# Patient Record
Sex: Female | Born: 1975 | Race: White | Hispanic: No | Marital: Married | Smoking: Former smoker
Health system: Southern US, Community
[De-identification: ages and names within clinical notes are randomized; demographics above are authoritative.]

## PROBLEM LIST (undated history)

## (undated) DIAGNOSIS — F909 Attention-deficit hyperactivity disorder, unspecified type: Secondary | ICD-10-CM

## (undated) DIAGNOSIS — F319 Bipolar disorder, unspecified: Secondary | ICD-10-CM

## (undated) DIAGNOSIS — T4145XA Adverse effect of unspecified anesthetic, initial encounter: Secondary | ICD-10-CM

## (undated) DIAGNOSIS — T8859XA Other complications of anesthesia, initial encounter: Secondary | ICD-10-CM

## (undated) HISTORY — PX: WISDOM TOOTH EXTRACTION: SHX21

## (undated) HISTORY — PX: OTHER SURGICAL HISTORY: SHX169

---

## 2005-05-08 ENCOUNTER — Other Ambulatory Visit: Admission: RE | Admit: 2005-05-08 | Discharge: 2005-05-08 | Payer: Self-pay | Admitting: Obstetrics and Gynecology

## 2005-05-19 ENCOUNTER — Encounter: Admission: RE | Admit: 2005-05-19 | Discharge: 2005-05-19 | Payer: Self-pay | Admitting: *Deleted

## 2006-04-19 ENCOUNTER — Ambulatory Visit (HOSPITAL_COMMUNITY): Payer: Self-pay | Admitting: *Deleted

## 2006-05-15 ENCOUNTER — Emergency Department (HOSPITAL_COMMUNITY): Admission: EM | Admit: 2006-05-15 | Discharge: 2006-05-16 | Payer: Self-pay | Admitting: Emergency Medicine

## 2006-08-09 ENCOUNTER — Other Ambulatory Visit: Admission: RE | Admit: 2006-08-09 | Discharge: 2006-08-09 | Payer: Self-pay | Admitting: Obstetrics and Gynecology

## 2007-09-09 ENCOUNTER — Other Ambulatory Visit: Admission: RE | Admit: 2007-09-09 | Discharge: 2007-09-09 | Payer: Self-pay | Admitting: Obstetrics & Gynecology

## 2009-01-18 ENCOUNTER — Inpatient Hospital Stay (HOSPITAL_COMMUNITY): Admission: AD | Admit: 2009-01-18 | Discharge: 2009-01-20 | Payer: Self-pay | Admitting: Obstetrics and Gynecology

## 2009-02-08 ENCOUNTER — Ambulatory Visit: Admission: RE | Admit: 2009-02-08 | Discharge: 2009-02-08 | Payer: Self-pay | Admitting: Obstetrics and Gynecology

## 2009-04-14 ENCOUNTER — Ambulatory Visit: Payer: Self-pay | Admitting: Licensed Clinical Social Worker

## 2009-06-21 ENCOUNTER — Ambulatory Visit: Payer: Self-pay | Admitting: Licensed Clinical Social Worker

## 2011-01-18 LAB — CBC
HCT: 31.7 % — ABNORMAL LOW (ref 36.0–46.0)
Hemoglobin: 12.3 g/dL (ref 12.0–15.0)
MCHC: 34.1 g/dL (ref 30.0–36.0)
MCV: 96.2 fL (ref 78.0–100.0)
MCV: 97.5 fL (ref 78.0–100.0)
RBC: 3.8 MIL/uL — ABNORMAL LOW (ref 3.87–5.11)
RDW: 14.4 % (ref 11.5–15.5)
WBC: 18.4 10*3/uL — ABNORMAL HIGH (ref 4.0–10.5)

## 2011-01-18 LAB — RPR: RPR Ser Ql: NONREACTIVE

## 2011-02-21 NOTE — Discharge Summary (Signed)
NAMESHERRAL, DIROCCO               ACCOUNT NO.:  [id number]   MEDICAL RECORD NO.:  [id number]          PATIENT TYPE:  INP   LOCATION:  9115                          FACILITY:  [facility]   PHYSICIAN:  Gerrit Friends. Aldona Bar, M.D.   DATE OF BIRTH:  1976-07-08   DATE OF ADMISSION:  01/18/2009  DATE OF DISCHARGE:                               DISCHARGE SUMMARY   DISCHARGE DIAGNOSES:  1. Term pregnancy, delivered 7 pounds 4 ounces, female infant, Apgars 8      and 9.  2. Blood type O+.   PROCEDURES:  1. Normal spontaneous delivery.  2. First-degree tear and repair.   SUMMARY:  This 35 year old gravida 2, now para 1, was admitted in labor  at term after benign pregnancy.  She progressed and had a normal  spontaneous delivery of a viable female infant weighing 7 pounds 4 ounces  with good Apgar over first-degree tear and a right labial tear, both of  which were repaired without difficulty.  Her postpartum course was  benign.  She had a hemoglobin of 10.8 and a white count of 15,300, and a  platelet count of 209,000 on January 19, 2009.  On the morning of January 20, 2009, she was doing well, vital signs were stable, she was breast-  feeding without difficulty, having normal bowel and bladder function,  was afebrile, and she was desirous of discharge.  Accordingly, she was  given all appropriate instructions per discharge brochure and understood  all instructions well.   DISCHARGE MEDICATIONS:  Vitamins one a day as long as she is breast-  feeding.  She will also use Motrin 600 mg every 6 hours as needed for  cramping or mild pain and Tylox 1-2 every 4-6 hours as needed for more  severe pain.  She was given prescriptions for both of these medications.  She will return to the office for followup in approximately 4 weeks'  time or as needed.   CONDITION ON DISCHARGE:  Improved.      Gerrit Friends. Aldona Bar, M.D.  Electronically Signed     RMW/MEDQ  D:  01/20/2009  T:  01/21/2009  Job:  [id number]

## 2016-12-12 ENCOUNTER — Other Ambulatory Visit: Payer: Self-pay | Admitting: Obstetrics and Gynecology

## 2016-12-12 DIAGNOSIS — R928 Other abnormal and inconclusive findings on diagnostic imaging of breast: Secondary | ICD-10-CM

## 2016-12-15 ENCOUNTER — Ambulatory Visit
Admission: RE | Admit: 2016-12-15 | Discharge: 2016-12-15 | Disposition: A | Discharge status: Home / Self Care | Payer: BLUE CROSS/BLUE SHIELD | Source: Ambulatory Visit | Attending: Obstetrics and Gynecology | Admitting: Obstetrics and Gynecology

## 2016-12-15 DIAGNOSIS — R928 Other abnormal and inconclusive findings on diagnostic imaging of breast: Secondary | ICD-10-CM

## 2017-10-01 ENCOUNTER — Emergency Department (HOSPITAL_BASED_OUTPATIENT_CLINIC_OR_DEPARTMENT_OTHER): Payer: BLUE CROSS/BLUE SHIELD

## 2017-10-01 ENCOUNTER — Other Ambulatory Visit: Payer: Self-pay

## 2017-10-01 ENCOUNTER — Encounter (HOSPITAL_BASED_OUTPATIENT_CLINIC_OR_DEPARTMENT_OTHER): Payer: Self-pay | Admitting: Emergency Medicine

## 2017-10-01 ENCOUNTER — Emergency Department (HOSPITAL_BASED_OUTPATIENT_CLINIC_OR_DEPARTMENT_OTHER)
Admission: EM | Admit: 2017-10-01 | Discharge: 2017-10-01 | Disposition: A | Discharge status: Home / Self Care | Payer: BLUE CROSS/BLUE SHIELD | Attending: Emergency Medicine | Admitting: Emergency Medicine

## 2017-10-01 DIAGNOSIS — S42031A Displaced fracture of lateral end of right clavicle, initial encounter for closed fracture: Secondary | ICD-10-CM | POA: Insufficient documentation

## 2017-10-01 DIAGNOSIS — Y92009 Unspecified place in unspecified non-institutional (private) residence as the place of occurrence of the external cause: Secondary | ICD-10-CM | POA: Diagnosis not present

## 2017-10-01 DIAGNOSIS — Y999 Unspecified external cause status: Secondary | ICD-10-CM | POA: Diagnosis not present

## 2017-10-01 DIAGNOSIS — Y9389 Activity, other specified: Secondary | ICD-10-CM | POA: Diagnosis not present

## 2017-10-01 DIAGNOSIS — W19XXXA Unspecified fall, initial encounter: Secondary | ICD-10-CM

## 2017-10-01 DIAGNOSIS — S4991XA Unspecified injury of right shoulder and upper arm, initial encounter: Secondary | ICD-10-CM | POA: Diagnosis present

## 2017-10-01 HISTORY — DX: Attention-deficit hyperactivity disorder, unspecified type: F90.9

## 2017-10-01 MED ORDER — TRAMADOL HCL 50 MG PO TABS: 50.0000 mg | ORAL_TABLET | Freq: Four times a day (QID) | ORAL | 0 refills | Status: DC | PRN

## 2017-10-01 MED ORDER — OXYCODONE-ACETAMINOPHEN 5-325 MG PO TABS
1.0000 | ORAL_TABLET | Freq: Once | ORAL | Status: DC
  Filled 2017-10-01: qty 1

## 2017-10-01 MED ORDER — TRAMADOL HCL 50 MG PO TABS
50.0000 mg | ORAL_TABLET | Freq: Once | ORAL | Status: AC
  Administered 2017-10-01: 50 mg via ORAL
  Filled 2017-10-01: qty 1

## 2017-10-01 NOTE — Discharge Instructions (Signed)
Wear the sling until you see the orthopedic doctor. Apply ice several times a day.   Sleep in a recliner until you are able to get up from a lying position without severe pain.    Take ibuprofen or naproxen as needed for less severe pain

## 2017-10-01 NOTE — ED Provider Notes (Signed)
[facility] EMERGENCY DEPARTMENT Provider Note   CSN: [id number] Arrival date & time: 10/01/17  0351     History   Chief Complaint Chief Complaint  Patient presents with  . Shoulder Injury    HPI Gina Baldwin is a 41 y.o. female.  The history is provided by the patient.  She fell off of a hover board injuring her right shoulder.  She denies other injury.  She denies head, neck, back injury.  She rates pain at 10/10.  She applied an Ace bandage to try to immobilize it and came to the emergency department.  Past Medical History:  Diagnosis Date  . ADHD     There are no active problems to display for this patient.   Past Surgical History:  Procedure Laterality Date  . tubaligation      OB History    No data available       Home Medications    Prior to Admission medications   Not on File    Family History No family history on file.  Social History Social History   Tobacco Use  . Smoking status: Former Games developermoker  . Smokeless tobacco: Never Used  Substance Use Topics  . Alcohol use: Yes  . Drug use: No     Allergies   Patient has no known allergies.   Review of Systems Review of Systems  All other systems reviewed and are negative.    Physical Exam Updated Vital Signs BP 120/78 (BP Location: Left Arm)   Pulse 83   Temp 97.6 F (36.4 C) (Oral)   Resp 20   Ht 5\' 7"  (1.702 m)   Wt 57.6 kg (127 lb)   LMP 09/28/2017   SpO2 100%   BMI 19.89 kg/m   Physical Exam  Nursing note and vitals reviewed.  41 year old female, resting comfortably and in no acute distress. Vital signs are normal. Oxygen saturation is 100%, which is normal. Head is normocephalic and atraumatic. PERRLA, EOMI. Oropharynx is clear. Neck is nontender and supple without adenopathy or JVD. Back is nontender and there is no CVA tenderness. Lungs are clear without rales, wheezes, or rhonchi. Chest is nontender. Heart has regular rate and rhythm without  murmur. Abdomen is soft, flat, nontender without masses or hepatosplenomegaly and peristalsis is normoactive. Extremities: Deformity is present over the right clavicle with tenderness in the same area.  No other extremity injury seen. Skin is warm and dry without rash. Neurologic: Mental status is normal, cranial nerves are intact, there are no motor or sensory deficits.  ED Treatments / Results   Radiology Dg Clavicle Right  Result Date: 10/01/2017 CLINICAL DATA:  Fall EXAM: RIGHT CLAVICLE - 2+ VIEWS COMPARISON:  None. FINDINGS: There is a comminuted fracture of the distal aspect of the right clavicle. The main fragments are inferiorly displaced. The acromioclavicular and glenohumeral joints remain approximated. IMPRESSION: Comminuted fracture of the distal right clavicle without dislocation. Electronically Signed   By: Deatra RobinsonKevin  Herman M.D.   On: 10/01/2017 04:49   Dg Shoulder Right  Result Date: 10/01/2017 CLINICAL DATA:  Status post fall off hover board, with acute onset of right shoulder pain. Initial encounter. EXAM: RIGHT SHOULDER - 2+ VIEW COMPARISON:  None. FINDINGS: There is a comminuted fracture of the distal third of the right clavicle, with variable displacement depending on positioning. This appears to extend to the edge of the right acromioclavicular joint. The proximal right humerus appears intact. The right humeral head remains seated at  the glenoid fossa. The visualized portions of the lungs are grossly clear. IMPRESSION: Comminuted fracture of the distal third of the right clavicle, with variable displacement depending on positioning. This appears to extend to the edge of the right acromioclavicular joint. Electronically Signed   By: Roanna RaiderJeffery  Chang M.D.   On: 10/01/2017 04:32    Procedures Procedures (including critical care time)  Medications Ordered in ED Medications  oxyCODONE-acetaminophen (PERCOCET/ROXICET) 5-325 MG per tablet 1 tablet (not administered)     Initial  Impression / Assessment and Plan / ED Course  I have reviewed the triage vital signs and the nursing notes.  Pertinent labs & imaging results that were available during my care of the patient were reviewed by me and considered in my medical decision making (see chart for details).  Fall with apparent injury to right clavicle.  She is sent for x-rays.  Old records were reviewed, and she has no relevant past visits.  X-rays confirm clavicle fracture.  She is placed in a sling and referred to orthopedics for follow-up.  Prescription given for tramadol (she has severe nausea and vomiting if she takes oxycodone).  Final Clinical Impressions(s) / ED Diagnoses   Final diagnoses:  Fall at home, initial encounter  Traumatic closed displaced fracture of acromial end of clavicle, right, initial encounter    ED Discharge Orders        Ordered    traMADol (ULTRAM) 50 MG tablet  Every 6 hours PRN     10/01/17 0456       Dione BoozeGlick, Aerabella Galasso, MD 10/01/17 [id number]

## 2017-10-01 NOTE — ED Notes (Signed)
PMS intact before and after. Pt tolerated well. All questions answered. 

## 2017-10-01 NOTE — ED Triage Notes (Signed)
Pt presents with c/o right shoulder pain after falling off hoover board tonight.

## 2017-10-10 ENCOUNTER — Encounter (HOSPITAL_COMMUNITY): Payer: Self-pay | Admitting: *Deleted

## 2017-10-10 ENCOUNTER — Other Ambulatory Visit: Payer: Self-pay | Admitting: Orthopedic Surgery

## 2017-10-10 ENCOUNTER — Other Ambulatory Visit: Payer: Self-pay

## 2017-10-10 NOTE — Progress Notes (Signed)
Spoke with pt for pre-op call. Pt denies cardiac history, chest pain, sob or diabetes. 

## 2017-10-11 ENCOUNTER — Ambulatory Visit (HOSPITAL_COMMUNITY): Payer: BLUE CROSS/BLUE SHIELD | Admitting: Anesthesiology

## 2017-10-11 ENCOUNTER — Ambulatory Visit (HOSPITAL_COMMUNITY)
Admission: RE | Admit: 2017-10-11 | Discharge: 2017-10-11 | Disposition: A | Discharge status: Home / Self Care | Payer: BLUE CROSS/BLUE SHIELD | Source: Ambulatory Visit | Attending: Orthopedic Surgery | Admitting: Orthopedic Surgery

## 2017-10-11 ENCOUNTER — Ambulatory Visit (HOSPITAL_COMMUNITY): Payer: BLUE CROSS/BLUE SHIELD

## 2017-10-11 ENCOUNTER — Encounter (HOSPITAL_COMMUNITY)
Admission: RE | Disposition: A | Discharge status: Home / Self Care | Payer: Self-pay | Source: Ambulatory Visit | Attending: Orthopedic Surgery

## 2017-10-11 ENCOUNTER — Encounter (HOSPITAL_COMMUNITY): Payer: Self-pay

## 2017-10-11 DIAGNOSIS — Z91018 Allergy to other foods: Secondary | ICD-10-CM | POA: Diagnosis not present

## 2017-10-11 DIAGNOSIS — W19XXXA Unspecified fall, initial encounter: Secondary | ICD-10-CM | POA: Diagnosis not present

## 2017-10-11 DIAGNOSIS — Z79899 Other long term (current) drug therapy: Secondary | ICD-10-CM | POA: Insufficient documentation

## 2017-10-11 DIAGNOSIS — S42031A Displaced fracture of lateral end of right clavicle, initial encounter for closed fracture: Secondary | ICD-10-CM | POA: Insufficient documentation

## 2017-10-11 DIAGNOSIS — F909 Attention-deficit hyperactivity disorder, unspecified type: Secondary | ICD-10-CM | POA: Diagnosis not present

## 2017-10-11 DIAGNOSIS — Z885 Allergy status to narcotic agent status: Secondary | ICD-10-CM | POA: Diagnosis not present

## 2017-10-11 DIAGNOSIS — F319 Bipolar disorder, unspecified: Secondary | ICD-10-CM | POA: Insufficient documentation

## 2017-10-11 DIAGNOSIS — Z87891 Personal history of nicotine dependence: Secondary | ICD-10-CM | POA: Insufficient documentation

## 2017-10-11 DIAGNOSIS — Z419 Encounter for procedure for purposes other than remedying health state, unspecified: Secondary | ICD-10-CM

## 2017-10-11 HISTORY — DX: Adverse effect of unspecified anesthetic, initial encounter: T41.45XA

## 2017-10-11 HISTORY — DX: Other complications of anesthesia, initial encounter: T88.59XA

## 2017-10-11 HISTORY — DX: Bipolar disorder, unspecified: F31.9

## 2017-10-11 HISTORY — PX: ORIF CLAVICULAR FRACTURE: SHX5055

## 2017-10-11 LAB — CBC
HCT: 35.8 % — ABNORMAL LOW (ref 36.0–46.0)
Hemoglobin: 11.8 g/dL — ABNORMAL LOW (ref 12.0–15.0)
MCH: 31.4 pg (ref 26.0–34.0)
MCHC: 33 g/dL (ref 30.0–36.0)
MCV: 95.2 fL (ref 78.0–100.0)
PLATELETS: 250 10*3/uL (ref 150–400)
RBC: 3.76 MIL/uL — AB (ref 3.87–5.11)
RDW: 12.7 % (ref 11.5–15.5)
WBC: 6.7 10*3/uL (ref 4.0–10.5)

## 2017-10-11 LAB — HCG, SERUM, QUALITATIVE: Preg, Serum: NEGATIVE

## 2017-10-11 SURGERY — OPEN REDUCTION INTERNAL FIXATION (ORIF) CLAVICULAR FRACTURE
Anesthesia: General | Laterality: Right

## 2017-10-11 MED ORDER — 0.9 % SODIUM CHLORIDE (POUR BTL) OPTIME
TOPICAL | Status: DC | PRN
  Administered 2017-10-11: 1000 mL

## 2017-10-11 MED ORDER — ROCURONIUM BROMIDE 10 MG/ML (PF) SYRINGE
PREFILLED_SYRINGE | INTRAVENOUS | Status: AC
  Filled 2017-10-11: qty 5

## 2017-10-11 MED ORDER — MEPERIDINE HCL 25 MG/ML IJ SOLN: 6.2500 mg | INTRAMUSCULAR | Status: DC | PRN

## 2017-10-11 MED ORDER — EPHEDRINE SULFATE-NACL 50-0.9 MG/10ML-% IV SOSY
PREFILLED_SYRINGE | INTRAVENOUS | Status: DC | PRN
  Administered 2017-10-11: 7.5 mg via INTRAVENOUS

## 2017-10-11 MED ORDER — BUPIVACAINE-EPINEPHRINE 0.25% -1:200000 IJ SOLN
INTRAMUSCULAR | Status: DC | PRN
  Administered 2017-10-11: 10 mL

## 2017-10-11 MED ORDER — ONDANSETRON HCL 4 MG/2ML IJ SOLN
INTRAMUSCULAR | Status: DC | PRN
  Administered 2017-10-11: 4 mg via INTRAVENOUS

## 2017-10-11 MED ORDER — HYDROMORPHONE HCL 1 MG/ML IJ SOLN
INTRAMUSCULAR | Status: AC
  Filled 2017-10-11: qty 1

## 2017-10-11 MED ORDER — EPHEDRINE 5 MG/ML INJ
INTRAVENOUS | Status: AC
  Filled 2017-10-11: qty 10

## 2017-10-11 MED ORDER — MIDAZOLAM HCL 2 MG/2ML IJ SOLN
INTRAMUSCULAR | Status: AC
  Filled 2017-10-11: qty 2

## 2017-10-11 MED ORDER — PROPOFOL 10 MG/ML IV BOLUS
INTRAVENOUS | Status: AC
  Filled 2017-10-11: qty 20

## 2017-10-11 MED ORDER — HYDROMORPHONE HCL 1 MG/ML IJ SOLN
0.2500 mg | INTRAMUSCULAR | Status: DC | PRN
  Administered 2017-10-11 (×4): 0.5 mg via INTRAVENOUS

## 2017-10-11 MED ORDER — PHENYLEPHRINE HCL 10 MG/ML IJ SOLN
INTRAVENOUS | Status: DC | PRN
  Administered 2017-10-11: 10 ug/min via INTRAVENOUS

## 2017-10-11 MED ORDER — FENTANYL CITRATE (PF) 100 MCG/2ML IJ SOLN
INTRAMUSCULAR | Status: DC | PRN
  Administered 2017-10-11 (×5): 50 ug via INTRAVENOUS

## 2017-10-11 MED ORDER — FENTANYL CITRATE (PF) 250 MCG/5ML IJ SOLN
INTRAMUSCULAR | Status: AC
  Filled 2017-10-11: qty 5

## 2017-10-11 MED ORDER — ONDANSETRON HCL 4 MG/2ML IJ SOLN
INTRAMUSCULAR | Status: AC
  Filled 2017-10-11: qty 2

## 2017-10-11 MED ORDER — DEXAMETHASONE SODIUM PHOSPHATE 10 MG/ML IJ SOLN
INTRAMUSCULAR | Status: DC | PRN
  Administered 2017-10-11: 10 mg via INTRAVENOUS

## 2017-10-11 MED ORDER — ROCURONIUM BROMIDE 100 MG/10ML IV SOLN
INTRAVENOUS | Status: DC | PRN
  Administered 2017-10-11: 50 mg via INTRAVENOUS

## 2017-10-11 MED ORDER — ONDANSETRON HCL 4 MG PO TABS: 4.0000 mg | ORAL_TABLET | Freq: Every day | ORAL | 1 refills | Status: AC | PRN

## 2017-10-11 MED ORDER — DEXAMETHASONE SODIUM PHOSPHATE 10 MG/ML IJ SOLN
INTRAMUSCULAR | Status: AC
  Filled 2017-10-11: qty 1

## 2017-10-11 MED ORDER — CEFAZOLIN SODIUM-DEXTROSE 2-4 GM/100ML-% IV SOLN
2.0000 g | INTRAVENOUS | Status: AC
  Administered 2017-10-11: 2 g via INTRAVENOUS
  Filled 2017-10-11: qty 100

## 2017-10-11 MED ORDER — LACTATED RINGERS IV SOLN
INTRAVENOUS | Status: DC | PRN
  Administered 2017-10-11: 07:00:00 via INTRAVENOUS

## 2017-10-11 MED ORDER — BUPIVACAINE-EPINEPHRINE 0.25% -1:200000 IJ SOLN
INTRAMUSCULAR | Status: AC
  Filled 2017-10-11: qty 1

## 2017-10-11 MED ORDER — MIDAZOLAM HCL 5 MG/5ML IJ SOLN
INTRAMUSCULAR | Status: DC | PRN
  Administered 2017-10-11 (×2): 1 mg via INTRAVENOUS

## 2017-10-11 MED ORDER — OXYCODONE-ACETAMINOPHEN 5-325 MG PO TABS: 1.0000 | ORAL_TABLET | ORAL | 0 refills | Status: DC | PRN

## 2017-10-11 MED ORDER — LIDOCAINE HCL (CARDIAC) 20 MG/ML IV SOLN
INTRAVENOUS | Status: DC | PRN
  Administered 2017-10-11: 80 mg via INTRAVENOUS

## 2017-10-11 MED ORDER — PROMETHAZINE HCL 25 MG/ML IJ SOLN: 6.2500 mg | INTRAMUSCULAR | Status: DC | PRN

## 2017-10-11 MED ORDER — LIDOCAINE 2% (20 MG/ML) 5 ML SYRINGE
INTRAMUSCULAR | Status: AC
  Filled 2017-10-11: qty 5

## 2017-10-11 MED ORDER — SUGAMMADEX SODIUM 200 MG/2ML IV SOLN
INTRAVENOUS | Status: AC
  Filled 2017-10-11: qty 2

## 2017-10-11 MED ORDER — PROPOFOL 10 MG/ML IV BOLUS
INTRAVENOUS | Status: DC | PRN
  Administered 2017-10-11: 100 mg via INTRAVENOUS

## 2017-10-11 MED ORDER — POVIDONE-IODINE 7.5 % EX SOLN
Freq: Once | CUTANEOUS | Status: DC
  Filled 2017-10-11: qty 118

## 2017-10-11 SURGICAL SUPPLY — 63 items
BIT DRILL 2.0 LNG QUCK RELEASE (BIT) IMPLANT
BIT DRILL 2.8 QUICK RELEASE (BIT) IMPLANT
BIT DRILL 3.0X5 QUICK RELEASE (DRILL) IMPLANT
CHLORAPREP W/TINT 26ML (MISCELLANEOUS) ×2 IMPLANT
COVER SURGICAL LIGHT HANDLE (MISCELLANEOUS) ×2 IMPLANT
DRAPE C-ARM 42X72 X-RAY (DRAPES) ×2 IMPLANT
DRAPE IMP U-DRAPE 54X76 (DRAPES) ×2 IMPLANT
DRAPE INCISE IOBAN 66X45 STRL (DRAPES) ×2 IMPLANT
DRAPE ORTHO SPLIT 77X108 STRL (DRAPES) ×4
DRAPE SURG ORHT 6 SPLT 77X108 (DRAPES) IMPLANT
DRAPE U-SHAPE 47X51 STRL (DRAPES) ×2 IMPLANT
DRILL 2.0 LNG QUICK RELEASE (BIT) ×2
DRILL 2.8 QUICK RELEASE (BIT) ×2
DRILL 3.0X5 QUICK RELEASE (DRILL) ×2
DRSG MEPILEX BORDER 4X8 (GAUZE/BANDAGES/DRESSINGS) ×1 IMPLANT
ELECT REM PT RETURN 9FT ADLT (ELECTROSURGICAL)
ELECTRODE REM PT RTRN 9FT ADLT (ELECTROSURGICAL) IMPLANT
GLOVE BIO SURGEON STRL SZ7 (GLOVE) ×2 IMPLANT
GLOVE BIO SURGEON STRL SZ7.5 (GLOVE) ×2 IMPLANT
GLOVE BIOGEL PI IND STRL 7.0 (GLOVE) ×1 IMPLANT
GLOVE BIOGEL PI IND STRL 8 (GLOVE) ×1 IMPLANT
GLOVE BIOGEL PI INDICATOR 7.0 (GLOVE) ×1
GLOVE BIOGEL PI INDICATOR 8 (GLOVE) ×1
GOWN STRL REUS W/ TWL LRG LVL3 (GOWN DISPOSABLE) ×3 IMPLANT
GOWN STRL REUS W/ TWL XL LVL3 (GOWN DISPOSABLE) ×1 IMPLANT
GOWN STRL REUS W/TWL LRG LVL3 (GOWN DISPOSABLE) ×6
GOWN STRL REUS W/TWL XL LVL3 (GOWN DISPOSABLE) ×2
KIT AC JOINT DISP (KITS) ×1 IMPLANT
KIT BASIN OR (CUSTOM PROCEDURE TRAY) ×2 IMPLANT
KIT BIO-TENODESIS 3X8 DISP (MISCELLANEOUS)
KIT INSRT BABSR STRL DISP BTN (MISCELLANEOUS) IMPLANT
KIT ROOM TURNOVER OR (KITS) ×2 IMPLANT
MANIFOLD NEPTUNE WASTE (CANNULA) ×2 IMPLANT
NDL HYPO 25GX1X1/2 BEV (NEEDLE) ×1 IMPLANT
NDL SPNL 18GX3.5 QUINCKE PK (NEEDLE) ×1 IMPLANT
NEEDLE HYPO 25GX1X1/2 BEV (NEEDLE) ×2 IMPLANT
NEEDLE SPNL 18GX3.5 QUINCKE PK (NEEDLE) ×2 IMPLANT
NS IRRIG 1000ML POUR BTL (IV SOLUTION) ×2 IMPLANT
PACK SHOULDER (CUSTOM PROCEDURE TRAY) ×2 IMPLANT
PACK UNIVERSAL I (CUSTOM PROCEDURE TRAY) ×1 IMPLANT
PAD ARMBOARD 7.5X6 YLW CONV (MISCELLANEOUS) ×4 IMPLANT
PLATE RIGHT DISTAL CLAVICLE (Plate) ×1 IMPLANT
RETRIEVER SUT HEWSON (MISCELLANEOUS) IMPLANT
SCREW BN FT 16X2.3XLCK HEX CRT (Screw) IMPLANT
SCREW CORTICAL 2.3X14 (Screw) ×1 IMPLANT
SCREW CORTICAL LOCKING 2.3X16M (Screw) ×10 IMPLANT
SCREW HEXALOBE NON-LOCK 3.5X14 (Screw) ×1 IMPLANT
SCREW NONLOCK HEX 3.5X12 (Screw) ×2 IMPLANT
SLING ARM FOAM STRAP LRG (SOFTGOODS) ×1 IMPLANT
SLING ARM FOAM STRAP MED (SOFTGOODS) ×1 IMPLANT
STRIP CLOSURE SKIN 1/2X4 (GAUZE/BANDAGES/DRESSINGS) ×2 IMPLANT
SUCTION FRAZIER HANDLE 10FR (MISCELLANEOUS) ×1
SUCTION TUBE FRAZIER 10FR DISP (MISCELLANEOUS) ×1 IMPLANT
SUT FIBERWIRE #2 38 T-5 BLUE (SUTURE)
SUT MON AB 4-0 PC3 18 (SUTURE) ×2 IMPLANT
SUT PDS AB 1 CT  36 (SUTURE) ×1
SUT PDS AB 1 CT 36 (SUTURE) ×1 IMPLANT
SUTURE FIBERWR #2 38 T-5 BLUE (SUTURE) IMPLANT
SYR CONTROL 10ML LL (SYRINGE) ×2 IMPLANT
TOWEL OR 17X24 6PK STRL BLUE (TOWEL DISPOSABLE) ×2 IMPLANT
TOWEL OR 17X26 10 PK STRL BLUE (TOWEL DISPOSABLE) ×2 IMPLANT
WATER STERILE IRR 1000ML POUR (IV SOLUTION) ×1 IMPLANT
ZIPLOOP AC JOINT REPAIR (Orthopedic Implant) ×1 IMPLANT

## 2017-10-11 NOTE — Anesthesia Procedure Notes (Signed)
Procedure Name: Intubation Date/Time: 10/11/2017 7:36 AM Performed by: Kyung Rudd, CRNA Pre-anesthesia Checklist: Patient identified, Emergency Drugs available, Suction available and Patient being monitored Patient Re-evaluated:Patient Re-evaluated prior to induction Oxygen Delivery Method: Circle system utilized Preoxygenation: Pre-oxygenation with 100% oxygen Induction Type: IV induction Ventilation: Mask ventilation without difficulty Laryngoscope Size: Mac and 3 Grade View: Grade I Tube type: Oral Tube size: 7.0 mm Number of attempts: 1 Airway Equipment and Method: Stylet Placement Confirmation: ETT inserted through vocal cords under direct vision,  positive ETCO2 and breath sounds checked- equal and bilateral Secured at: 21 cm Tube secured with: Tape Dental Injury: Teeth and Oropharynx as per pre-operative assessment

## 2017-10-11 NOTE — H&P (Signed)
Gina Baldwin is an 42 y.o. female.   Chief Complaint: R clavicle fracture HPI: s/p fall onto R shoulder 10/01/17 with comminuted displaced distal clavicle fracture.  Past Medical History:  Diagnosis Date  . ADHD   . ADHD   . Bipolar disorder (HCC)   . Complication of anesthesia    slow to wake up    Past Surgical History:  Procedure Laterality Date  . tubaligation    . WISDOM TOOTH EXTRACTION      Family History  Problem Relation Age of Onset  . Osteoporosis Mother    Social History:  reports that she has quit smoking. she has never used smokeless tobacco. She reports that she drinks alcohol. She reports that she does not use drugs.  Allergies:  Allergies  Allergen Reactions  . Food Anaphylaxis, Swelling and Other (See Comments)    Mushrooms-throat closes/swelling  . Oxycodone-Acetaminophen Nausea And Vomiting    Medications Prior to Admission  Medication Sig Dispense Refill  . ADDERALL XR 20 MG 24 hr capsule Take 40 mg by mouth daily.  0  . amphetamine-dextroamphetamine (ADDERALL) 20 MG tablet Take 20 mg by mouth daily as needed. For additional focus after 3 pm.  0  . Carbamazepine (EQUETRO) 100 MG CP12 12 hr capsule Take 100 mg by mouth every evening.    . cholecalciferol (VITAMIN D) 1000 units tablet Take 1,000 Units by mouth daily.    Marland Kitchen. lamoTRIgine (LAMICTAL) 150 MG tablet Take 150 mg by mouth every evening.  12  . traMADol (ULTRAM) 50 MG tablet Take 1 tablet (50 mg total) by mouth every 6 (six) hours as needed. (Patient taking differently: Take 50 mg by mouth every 6 (six) hours as needed (for pain.). ) 30 tablet 0    No results found for this or any previous visit (from the past 48 hour(s)). No results found.  Review of Systems  All other systems reviewed and are negative.   Blood pressure (!) 114/45, pulse (!) 59, temperature 98.3 F (36.8 C), temperature source Oral, resp. rate 20, height 5\' 7"  (1.702 m), weight 57.6 kg (127 lb), last menstrual period  09/28/2017, SpO2 98 %. Physical Exam  Constitutional: She is oriented to person, place, and time. She appears well-developed and well-nourished.  HENT:  Head: Atraumatic.  Eyes: EOM are normal.  Cardiovascular: Intact distal pulses.  Respiratory: Effort normal.  Musculoskeletal:  R  Shoulder mild swelling ecchymosis lateral clavicle, NVID.  Neurological: She is alert and oriented to person, place, and time.  Skin: Skin is warm and dry.  Psychiatric: She has a normal mood and affect.     Assessment/Plan R displaced distal clavicle fracture Plan ORIF with CC lig recon Risks / benefits of surgery discussed Consent on chart  NPO for OR Preop antibiotics   Berline LopesJustin W Courage Biglow, MD 10/11/2017, 7:09 AM

## 2017-10-11 NOTE — Anesthesia Postprocedure Evaluation (Signed)
Anesthesia Post Note  Patient: Gina Baldwin  Procedure(s) Performed: PEN REDUCTION INTERNAL FIXATION OF CLAVICLE FRACTURE WITH ACROMIOCLAVICULAR RECONSTRUCTION (Right )     Patient location during evaluation: PACU Anesthesia Type: General Level of consciousness: awake and alert Pain management: pain level controlled Vital Signs Assessment: post-procedure vital signs reviewed and stable Respiratory status: spontaneous breathing, nonlabored ventilation and respiratory function stable Cardiovascular status: blood pressure returned to baseline and stable Postop Assessment: no apparent nausea or vomiting Anesthetic complications: no    Last Vitals:  Vitals:   10/11/17 1054 10/11/17 1055  BP: 108/62   Pulse: 75   Resp: (!) 7   Temp:  37.2 C  SpO2: 93%     Last Pain:  Vitals:   10/11/17 1055  TempSrc:   PainSc: Asleep                 Lowella CurbWarren Ray Shabre Kreher

## 2017-10-11 NOTE — Discharge Instructions (Signed)

## 2017-10-11 NOTE — Transfer of Care (Signed)
Immediate Anesthesia Transfer of Care Note  Patient: Gina Baldwin  Procedure(s) Performed: PEN REDUCTION INTERNAL FIXATION OF CLAVICLE FRACTURE WITH ACROMIOCLAVICULAR RECONSTRUCTION (Right )  Patient Location: PACU  Anesthesia Type:General  Level of Consciousness: awake, alert  and oriented  Airway & Oxygen Therapy: Patient Spontanous Breathing and Patient connected to nasal cannula oxygen  Post-op Assessment: Report given to RN, Post -op Vital signs reviewed and stable and Patient moving all extremities  Post vital signs: Reviewed and stable  Last Vitals:  Vitals:   10/11/17 0603 10/11/17 0955  BP: (!) 114/45   Pulse: (!) 59   Resp: 20   Temp: 36.8 C 36.9 C  SpO2: 98%     Last Pain:  Vitals:   10/11/17 0955  TempSrc:   PainSc: 8       Patients Stated Pain Goal: 5 (10/11/17 [id number])  Complications: No apparent anesthesia complications

## 2017-10-11 NOTE — Anesthesia Preprocedure Evaluation (Addendum)
Anesthesia Evaluation  Patient identified by MRN, date of birth, ID band Patient awake    Reviewed: Allergy & Precautions, NPO status , Patient's Chart, lab work & pertinent test results  Airway Mallampati: I  TM Distance: >3 FB Neck ROM: Full    Dental no notable dental hx. (+) Teeth Intact, Dental Advisory Given   Pulmonary neg pulmonary ROS, former smoker,    Pulmonary exam normal breath sounds clear to auscultation       Cardiovascular negative cardio ROS Normal cardiovascular exam Rhythm:Regular Rate:Normal     Neuro/Psych negative neurological ROS  negative psych ROS   GI/Hepatic negative GI ROS, Neg liver ROS,   Endo/Other  negative endocrine ROS  Renal/GU negative Renal ROS  negative genitourinary   Musculoskeletal negative musculoskeletal ROS (+)   Abdominal   Peds negative pediatric ROS (+)  Hematology negative hematology ROS (+)   Anesthesia Other Findings ADHD  Reproductive/Obstetrics negative OB ROS                            Anesthesia Physical Anesthesia Plan  ASA: II  Anesthesia Plan: General   Post-op Pain Management:    Induction: Intravenous  PONV Risk Score and Plan: 3 and Ondansetron, Dexamethasone and Midazolam  Airway Management Planned: Oral ETT  Additional Equipment:   Intra-op Plan:   Post-operative Plan: Extubation in OR  Informed Consent: I have reviewed the patients History and Physical, chart, labs and discussed the procedure including the risks, benefits and alternatives for the proposed anesthesia with the patient or authorized representative who has indicated his/her understanding and acceptance.   Dental advisory given  Plan Discussed with: CRNA  Anesthesia Plan Comments:         Anesthesia Quick Evaluation

## 2017-10-11 NOTE — Op Note (Signed)
Procedure(s): PEN REDUCTION INTERNAL FIXATION OF CLAVICLE FRACTURE WITH ACROMIOCLAVICULAR RECONSTRUCTION Procedure Note  Gina Baldwin female 42 y.o. 10/11/2017  Procedure(s) and Anesthesia Typ4e: OPEN REDUCTION INTERNAL FIXATION OF CLAVICLE FRACTURE WITH ACROMIOCLAVICULAR RECONSTRUCTION - Choice  Surgeon(s) and Role:    Jones Broom, MD - Primary   Indications:  42 y.o. female s/p fall with right comminuted distal clavicle fracture. Indicated for surgery to promote anatomic restoration anatomy, improve functional outcome and avoid skin complications.     Surgeon: Berline Lopes   Assistants: Damita Lack PA-C [facility] was present and scrubbed throughout the procedure and was essential in positioning, retraction, exposure, and closure)  Anesthesia: General endotracheal anesthesia    Procedure Detail  OPEN REDUCTION INTERNAL FIXATION OF CLAVICLE FRACTURE WITH ACROMIOCLAVICULAR RECONSTRUCTION  Findings: Anatomic reconstruction of comminuted distal clavicle fracture with Acumed plate and Biomet zip loop for coracoclavicular fixation   Estimated Blood Loss:  less than 50 mL         Drains: none  Blood Given: none         Specimens: none        Complications:  * No complications entered in OR log *         Disposition: PACU - hemodynamically stable.         Condition: stable    Procedure:  DESCRIPTION OF PROCEDURE: The patient was identified in preoperative  holding area where I personally marked the operative site after  verifying site, side, and procedure with the patient. The patient was taken back  to the operating room where general anesthesia was induced without  complication and was placed in the beach-chair position with the back  elevated about 40 degrees and all extremities carefully padded and  positioned. The neck was turned very slightly away from the operative field  to assist in exposure. The right upper extremity was then prepped and   draped in a standard sterile fashion. The appropriate time-out  procedure was carried out. The patient did receive IV antibiotics  within 30 minutes of incision.  An incision was made in [employer] centered over the fracture site. Dissection was carried down through subcutaneous tissues and medial and lateral skin flaps were elevated.  The deltotrapezial fascia was then opened over the clavicle and the  medial and lateral fracture fragments were carefully exposed, taking great care to protect underlying neurovascular structures.  2 Butterfly fragments were kept in continuity with soft tissue as to not disrupt blood supply. Given the disruption of the coracoclavicular ligament complex causing medial elevation and the very distal fracture with minimal bone laterally I felt that a coracoclavicular fixation device would improve fixation.  Therefore the dorsal aspect of the coracoid was carefully exposed through the fracture site and a guidepin was placed over drilling with a 4.5 mm reamer.  The Biomet zip tight device was advanced under fluoroscopic imaging and had excellent fixation beneath the coracoid centrally. The plate was positioned on the bone using fluoroscopic imaging to verify position.  The medial end of the plate was then fixed with one screw.  The coracoclavicular fixation was then passed through a hole just adjacent to the fracture medially.  The plate was then fixed laterally while holding the anatomic reduction.  The coracoclavicular fixation button was then placed on the dorsal aspect of the plate and fixed in place while holding the reduction.  Fluoroscopic imaging was used throughout the procedure to verify position and reduction.  A total of 4 locking screws  were placed lateral to the fracture and 3 nonlocking screws medially.  One #2 FiberWire was used to reduce the central comminuted butterfly fragments back into the construct with a figure-of-eight suture pattern.  The she will be  observed in recovery and i  The wound was copiously irrigated with normal saline and the deltotrapezial fascia was  then carefully closed over the construct with #0 vicryl sutures in  interrupted fashion. The skin was then closed with 2-0 Vicryl in a deep  dermal layer, 4-0 Monocryl for skin closure. Steri-Strips were applied.  10 mL of 0.25% Marcaine with epinephrine were infiltrated for  postoperative pain. Sterile dressings were applied including a medium  Mepilex dressing. The patient was then allowed to awaken from general  anesthesia, placed in a sling, transferred to stretcher and taken to the  recovery room in stable condition.   POSTOPERATIVE PLAN: She will be observed in recovery and pain is well controlled can be discharged home with her husband.

## 2017-10-12 ENCOUNTER — Encounter (HOSPITAL_COMMUNITY): Payer: Self-pay | Admitting: Orthopedic Surgery

## 2018-02-21 ENCOUNTER — Other Ambulatory Visit: Payer: Self-pay | Admitting: Orthopedic Surgery

## 2018-02-21 DIAGNOSIS — M898X1 Other specified disorders of bone, shoulder: Secondary | ICD-10-CM

## 2018-03-06 ENCOUNTER — Ambulatory Visit
Admission: RE | Admit: 2018-03-06 | Discharge: 2018-03-06 | Disposition: A | Discharge status: Home / Self Care | Payer: BLUE CROSS/BLUE SHIELD | Source: Ambulatory Visit | Attending: Orthopedic Surgery | Admitting: Orthopedic Surgery

## 2018-03-06 DIAGNOSIS — M898X1 Other specified disorders of bone, shoulder: Secondary | ICD-10-CM

## 2018-06-27 ENCOUNTER — Other Ambulatory Visit: Payer: Self-pay | Admitting: Obstetrics and Gynecology

## 2018-06-27 DIAGNOSIS — Z1231 Encounter for screening mammogram for malignant neoplasm of breast: Secondary | ICD-10-CM

## 2019-01-30 IMAGING — DX DG CLAVICLE*R*
2 series · 2 of 2 positions shown · non-contrast
Comparison: None.

CLINICAL DATA: Fall

EXAM:
RIGHT CLAVICLE - 2+ VIEWS

[clavicle ap]
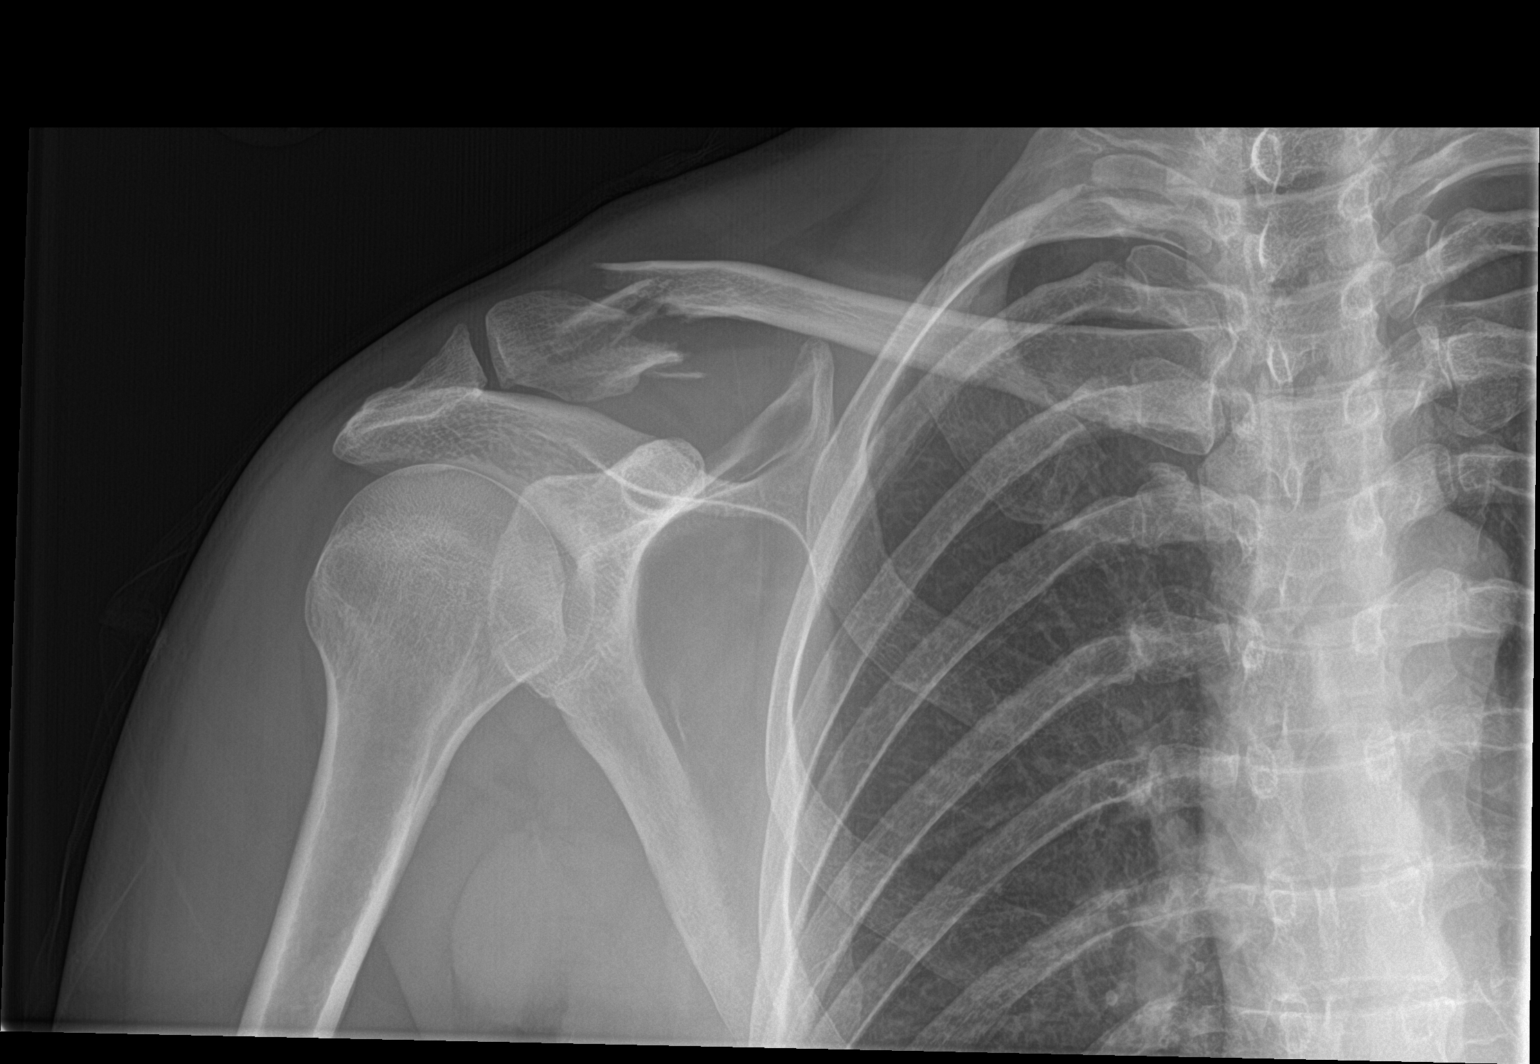

[clavicle axial]
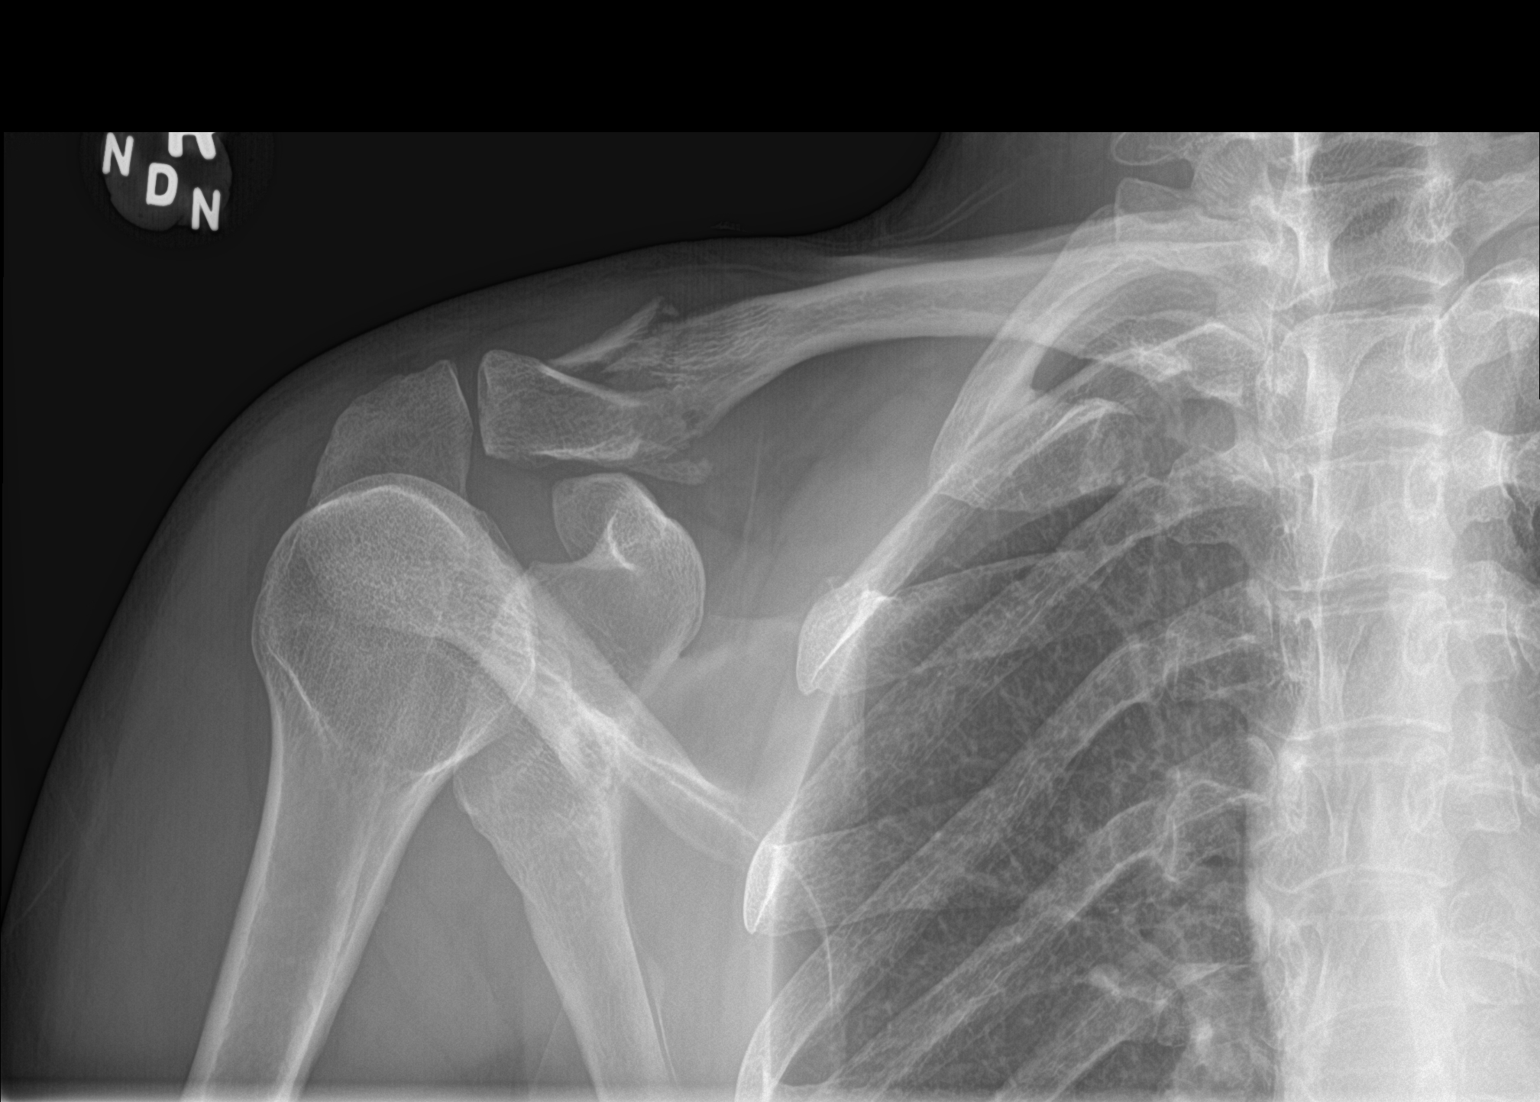

[2 of 2 positions shown; findings below may reference images not displayed]

FINDINGS: There is a comminuted fracture of the distal aspect of the right
clavicle. The main fragments are inferiorly displaced. The
acromioclavicular and glenohumeral joints remain approximated.
IMPRESSION: Comminuted fracture of the distal right clavicle without
dislocation.

## 2019-02-09 IMAGING — RF DG C-ARM 61-120 MIN
1 series · 3 of 3 positions shown · non-contrast
Comparison: 10/01/2017 right clavicle radiographs

CLINICAL DATA: ORIF right clavicle fracture

EXAM:
DG C-ARM 61-120 MIN; RIGHT CLAVICLE - 2+ VIEWS

[Series 1: run · 3 of 3 slices shown]
[im 1/3]
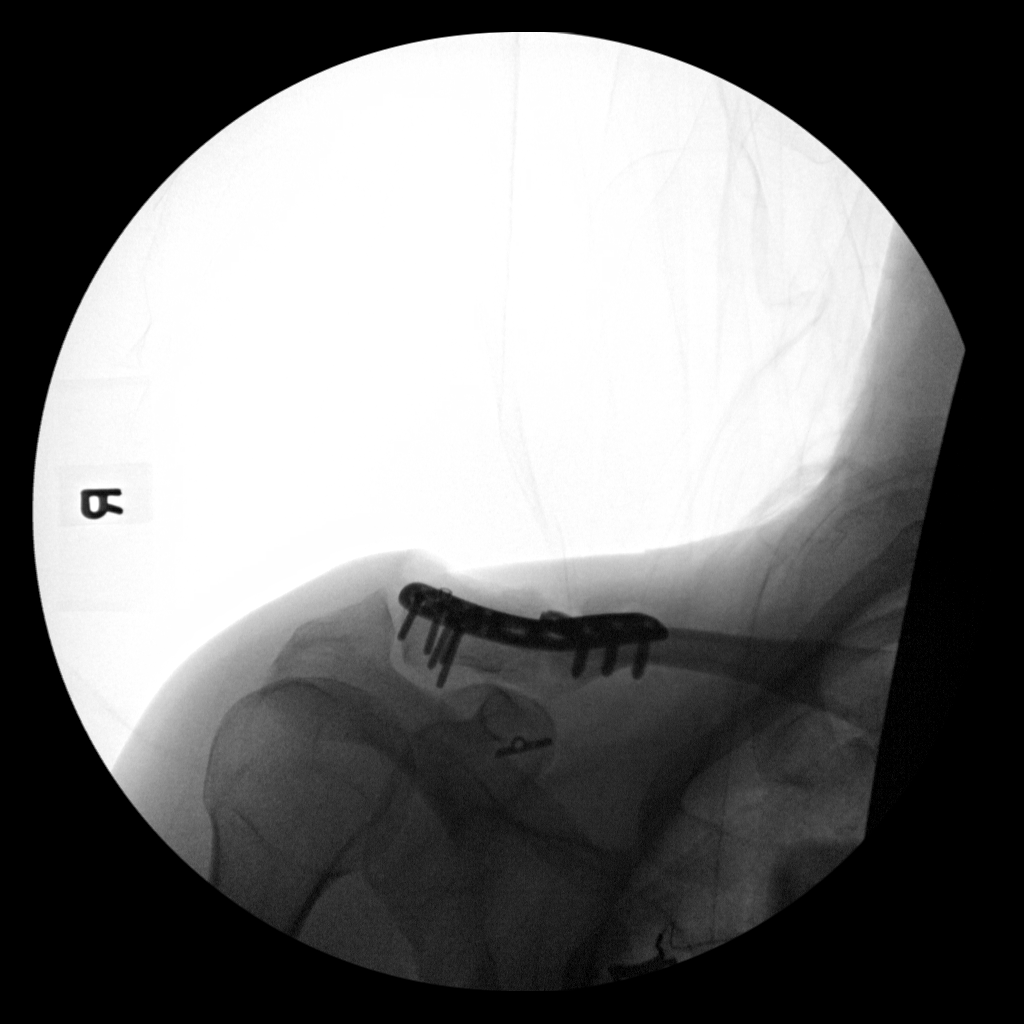
[im 2/3]
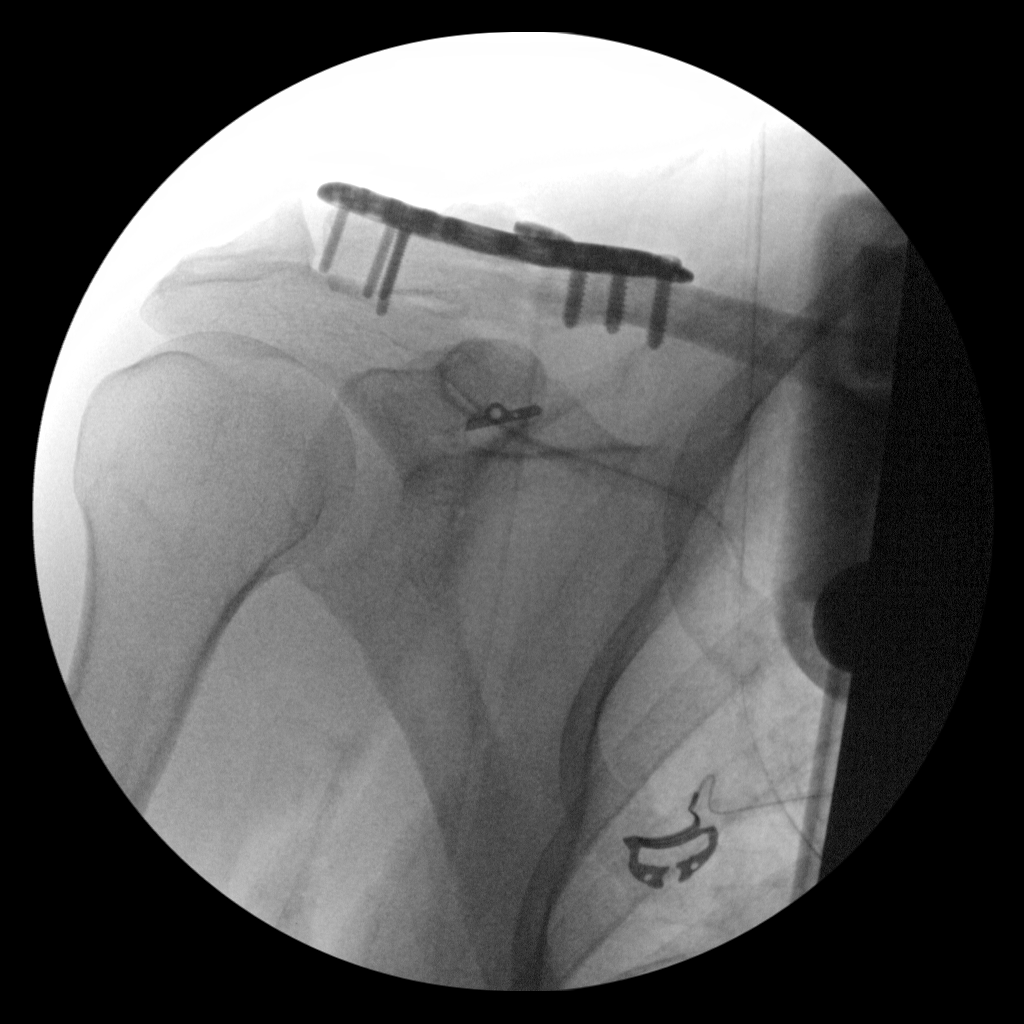
[im 3/3]
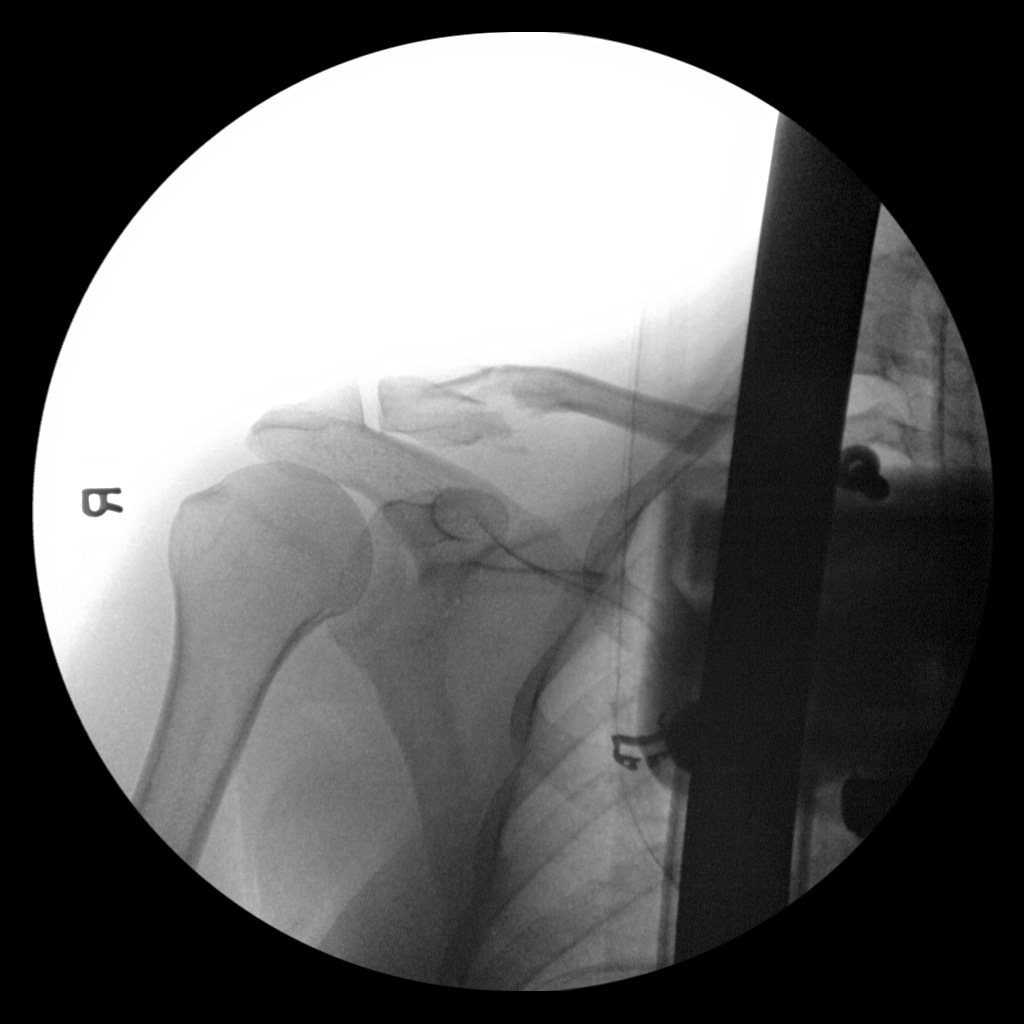

[3 of 3 positions shown; findings below may reference images not displayed]

FINDINGS: Fluoroscopy time 9.5 seconds. Three spot fluoroscopic nondiagnostic
intraoperative radiographs of the right clavicle demonstrate
transfixation of the lateral right clavicle fracture with surgical
plate and interlocking screws.
IMPRESSION: Intraoperative fluoroscopic guidance for ORIF right clavicle
fracture.

## 2020-06-01 ENCOUNTER — Other Ambulatory Visit (HOSPITAL_BASED_OUTPATIENT_CLINIC_OR_DEPARTMENT_OTHER): Payer: Self-pay | Admitting: Family Medicine

## 2020-06-01 DIAGNOSIS — Z1231 Encounter for screening mammogram for malignant neoplasm of breast: Secondary | ICD-10-CM

## 2020-06-02 ENCOUNTER — Other Ambulatory Visit: Payer: Self-pay

## 2020-06-02 ENCOUNTER — Ambulatory Visit (HOSPITAL_BASED_OUTPATIENT_CLINIC_OR_DEPARTMENT_OTHER)
Admission: RE | Admit: 2020-06-02 | Discharge: 2020-06-02 | Disposition: A | Discharge status: Home / Self Care | Payer: 59 | Source: Ambulatory Visit | Attending: Family Medicine | Admitting: Family Medicine

## 2020-06-02 DIAGNOSIS — Z1231 Encounter for screening mammogram for malignant neoplasm of breast: Secondary | ICD-10-CM | POA: Diagnosis present

## 2020-07-01 ENCOUNTER — Encounter: Payer: BLUE CROSS/BLUE SHIELD | Admitting: Family Medicine

## 2020-07-20 ENCOUNTER — Ambulatory Visit (INDEPENDENT_AMBULATORY_CARE_PROVIDER_SITE_OTHER): Payer: 59 | Admitting: Medical

## 2020-07-20 ENCOUNTER — Other Ambulatory Visit: Payer: Self-pay

## 2020-07-20 ENCOUNTER — Encounter: Payer: Self-pay | Admitting: Medical

## 2020-07-20 VITALS — BP 131/76 | HR 69 | Temp 97.6°F | Resp 18 | Ht 67.0 in | Wt 146.0 lb

## 2020-07-20 DIAGNOSIS — R7989 Other specified abnormal findings of blood chemistry: Secondary | ICD-10-CM

## 2020-07-20 DIAGNOSIS — Z0000 Encounter for general adult medical examination without abnormal findings: Secondary | ICD-10-CM

## 2020-07-20 DIAGNOSIS — F909 Attention-deficit hyperactivity disorder, unspecified type: Secondary | ICD-10-CM

## 2020-07-20 DIAGNOSIS — F319 Bipolar disorder, unspecified: Secondary | ICD-10-CM

## 2020-07-20 DIAGNOSIS — R5383 Other fatigue: Secondary | ICD-10-CM

## 2020-07-20 DIAGNOSIS — Z23 Encounter for immunization: Secondary | ICD-10-CM | POA: Diagnosis not present

## 2020-07-20 DIAGNOSIS — R252 Cramp and spasm: Secondary | ICD-10-CM

## 2020-07-20 NOTE — Progress Notes (Signed)
Subjective:    Patient ID: Gina Baldwin, female    DOB: October 18, 1975, 44 y.o.   MRN: [id number]  HPI  Pt in for first time to establish care. No routine pcp in past.  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 She does not exercise on regular basis, Pt states moderate healthy diet. Does vape sometimes. Occasional alcohol use on weekends. Coffee in am couple of cups a day.  Pt has hx of ADHD. She has been on Adderall since college. Pt sees Dr. Evelene Croon.  Pt ha bipolar as well Dr. Evelene Croon also prescribing lamictal.  Hx of low vit D.   Pt will be seeing gyn tomorrow well women visit at [facility]. Pt had mammogram recently.     Review of Systems  Constitutional: Negative for chills, fatigue and fever.  Respiratory: Negative for cough, chest tightness, shortness of breath and wheezing.   Cardiovascular: Negative for chest pain and palpitations.  Gastrointestinal: Negative for abdominal pain.  Genitourinary: Negative for dysuria and frequency.  Musculoskeletal: Negative for back pain.  Skin: Negative for rash.  Neurological: Negative for dizziness, numbness and headaches.  Hematological: Negative for adenopathy. Does not bruise/bleed easily.  Psychiatric/Behavioral: Negative for behavioral problems, decreased concentration, hallucinations and suicidal ideas.       Stable seeing pyschiatrist.    Past Medical History:  Diagnosis Date  . ADHD   . ADHD   . Bipolar disorder (HCC)   . Complication of anesthesia    slow to wake up     Social History   Socioeconomic History  . Marital status: Married    Spouse name: Not on file  . Number of children: Not on file  . Years of education: Not on file  . Highest education level: Not on file  Occupational History  . Not on file  Tobacco Use  . Smoking status: Former Games developer  . Smokeless tobacco: Never Used  Vaping Use  . Vaping Use: Some days  Substance and Sexual Activity  . Alcohol use: Yes    Comment: occasional  . Drug  use: No  . Sexual activity: Not on file  Other Topics Concern  . Not on file  Social History Narrative  . Not on file   Social Determinants of Health   Financial Resource Strain:   . Difficulty of Paying Living Expenses: Not on file  Food Insecurity:   . Worried About Programme researcher, broadcasting/film/video in the Last Year: Not on file  . Ran Out of Food in the Last Year: Not on file  Transportation Needs:   . Lack of Transportation (Medical): Not on file  . Lack of Transportation (Non-Medical): Not on file  Physical Activity:   . Days of Exercise per Week: Not on file  . Minutes of Exercise per Session: Not on file  Stress:   . Feeling of Stress : Not on file  Social Connections:   . Frequency of Communication with Friends and Family: Not on file  . Frequency of Social Gatherings with Friends and Family: Not on file  . Attends Religious Services: Not on file  . Active Member of Clubs or Organizations: Not on file  . Attends Banker Meetings: Not on file  . Marital Status: Not on file  Intimate Partner Violence:   . Fear of Current or Ex-Partner: Not on file  . Emotionally Abused: Not on file  . Physically Abused: Not on file  . Sexually Abused: Not on file  Past Surgical History:  Procedure Laterality Date  . ORIF CLAVICULAR FRACTURE Right 10/11/2017   Procedure: PEN REDUCTION INTERNAL FIXATION OF CLAVICLE FRACTURE WITH ACROMIOCLAVICULAR RECONSTRUCTION;  Surgeon: Jones Broom, MD;  Location: [facility] OR;  Service: Orthopedics;  Laterality: Right;  . tubaligation    . WISDOM TOOTH EXTRACTION      Family History  Problem Relation Age of Onset  . Osteoporosis Mother     Allergies  Allergen Reactions  . Food Anaphylaxis, Swelling and Other (See Comments)    Mushrooms-throat closes/swelling  . Oxycodone-Acetaminophen Nausea And Vomiting    Current Outpatient Medications on File Prior to Visit  Medication Sig Dispense Refill  . ADDERALL XR 20 MG 24 hr capsule Take 40 mg  by mouth daily.  0  . amphetamine-dextroamphetamine (ADDERALL) 20 MG tablet Take 20 mg by mouth daily as needed. For additional focus after 3 pm.  0  . lamoTRIgine (LAMICTAL) 150 MG tablet Take 150 mg by mouth Baldwin evening.  12  . cholecalciferol (VITAMIN D) 1000 units tablet Take 1,000 Units by mouth daily.    . traMADol (ULTRAM) 50 MG tablet Take 1 tablet (50 mg total) by mouth Baldwin 6 (six) hours as needed. (Patient taking differently: Take 50 mg by mouth Baldwin 6 (six) hours as needed (for pain.). ) 30 tablet 0   No current facility-administered medications on file prior to visit.    BP 131/76   Pulse 69   Temp 97.6 F (36.4 C) (Oral)   Resp 18   Ht 5\' 7"  (1.702 m)   Wt 146 lb (66.2 kg)   LMP 06/28/2020   SpO2 99%   BMI 22.87 kg/m       Objective:   Physical Exam   General Mental Status- Alert. General Appearance- Not in acute distress.   Skin General: Color- Normal Color. Moisture- Normal Moisture.  Neck Carotid Arteries- Normal color. Moisture- Normal Moisture. No carotid bruits. No JVD.  Chest and Lung Exam Auscultation: Breath Sounds:-Normal.  Cardiovascular Auscultation:Rythm- Regular. Murmurs & Other Heart Sounds:Auscultation of the heart reveals- No Murmurs.  Abdomen Inspection:-Inspeection Normal. Palpation/Percussion:Note:No mass. Palpation and Percussion of the abdomen reveal- Non Tender, Non Distended + BS, no rebound or guarding.   Neurologic Cranial Nerve exam:- CN III-XII intact(No nystagmus), symmetric smile. Strength:- 5/5 equal and symmetric strength both upper and lower extremities.     Assessment & Plan:  For you wellness exam today I have ordered cbc and  lipid panel.  Vaccine given today flu.  Recommend exercise and healthy diet.  We will let you know lab results as they come in.  Follow up date appointment will be determined after lab review.   For fatigue added tsh, t4, b12 and b1.  06/30/2020, PA-C

## 2020-07-20 NOTE — Addendum Note (Signed)
Addended by: Gwenevere Abbot on: 07/20/2020 09:38 AM   Modules accepted: Orders

## 2020-07-20 NOTE — Patient Instructions (Addendum)
For you wellness exam today I have ordered cbc and  lipid panel.  Vaccine given today flu.  Recommend exercise and healthy diet.  We will let you know lab results as they come in.  Follow up date appointment will be determined after lab review.   For fatigue added tsh, t4, b12 and b1.  For ADHD and bipolar continue to follow up with Psychiatrist.   Preventive Care 51-44 Years Old, Female Preventive care refers to visits with your health care provider and lifestyle choices that can promote health and wellness. This includes:  A yearly physical exam. This may also be called an annual well check.  Regular dental visits and eye exams.  Immunizations.  Screening for certain conditions.  Healthy lifestyle choices, such as eating a healthy diet, getting regular exercise, not using drugs or products that contain nicotine and tobacco, and limiting alcohol use. What can I expect for my preventive care visit? Physical exam Your health care provider will check your:  Height and weight. This may be used to calculate body mass index (BMI), which tells if you are at a healthy weight.  Heart rate and blood pressure.  Skin for abnormal spots. Counseling Your health care provider may ask you questions about your:  Alcohol, tobacco, and drug use.  Emotional well-being.  Home and relationship well-being.  Sexual activity.  Eating habits.  Work and work Statistician.  Method of birth control.  Menstrual cycle.  Pregnancy history. What immunizations do I need?  Influenza (flu) vaccine  This is recommended every year. Tetanus, diphtheria, and pertussis (Tdap) vaccine  You may need a Td booster every 10 years. Varicella (chickenpox) vaccine  You may need this if you have not been vaccinated. Human papillomavirus (HPV) vaccine  If recommended by your health care provider, you may need three doses over 6 months. Measles, mumps, and rubella (MMR) vaccine  You may need at  least one dose of MMR. You may also need a second dose. Meningococcal conjugate (MenACWY) vaccine  One dose is recommended if you are age 39-21 years and a first-year college student living in a residence hall, or if you have one of several medical conditions. You may also need additional booster doses. Pneumococcal conjugate (PCV13) vaccine  You may need this if you have certain conditions and were not previously vaccinated. Pneumococcal polysaccharide (PPSV23) vaccine  You may need one or two doses if you smoke cigarettes or if you have certain conditions. Hepatitis A vaccine  You may need this if you have certain conditions or if you travel or work in places where you may be exposed to hepatitis A. Hepatitis B vaccine  You may need this if you have certain conditions or if you travel or work in places where you may be exposed to hepatitis B. Haemophilus influenzae type b (Hib) vaccine  You may need this if you have certain conditions. You may receive vaccines as individual doses or as more than one vaccine together in one shot (combination vaccines). Talk with your health care provider about the risks and benefits of combination vaccines. What tests do I need?  Blood tests  Lipid and cholesterol levels. These may be checked every 5 years starting at age 26.  Hepatitis C test.  Hepatitis B test. Screening  Diabetes screening. This is done by checking your blood sugar (glucose) after you have not eaten for a while (fasting).  Sexually transmitted disease (STD) testing.  BRCA-related cancer screening. This may be done if you have  a family history of breast, ovarian, tubal, or peritoneal cancers.  Pelvic exam and Pap test. This may be done every 3 years starting at age 55. Starting at age 81, this may be done every 5 years if you have a Pap test in combination with an HPV test. Talk with your health care provider about your test results, treatment options, and if necessary, the  need for more tests. Follow these instructions at home: Eating and drinking   Eat a diet that includes fresh fruits and vegetables, whole grains, lean protein, and low-fat dairy.  Take vitamin and mineral supplements as recommended by your health care provider.  Do not drink alcohol if: ? Your health care provider tells you not to drink. ? You are pregnant, may be pregnant, or are planning to become pregnant.  If you drink alcohol: ? Limit how much you have to 0-1 drink a day. ? Be aware of how much alcohol is in your drink. In the [place] one drink equals one 12 oz bottle of beer (355 mL), one 5 oz glass of wine (148 mL), or one 1 oz glass of hard liquor (44 mL). Lifestyle  Take daily care of your teeth and gums.  Stay active. Exercise for at least 30 minutes on 5 or more days each week.  Do not use any products that contain nicotine or tobacco, such as cigarettes, e-cigarettes, and chewing tobacco. If you need help quitting, ask your health care provider.  If you are sexually active, practice safe sex. Use a condom or other form of birth control (contraception) in order to prevent pregnancy and STIs (sexually transmitted infections). If you plan to become pregnant, see your health care provider for a preconception visit. What's next?  Visit your health care provider once a year for a well check visit.  Ask your health care provider how often you should have your eyes and teeth checked.  Stay up to date on all vaccines. This information is not intended to replace advice given to you by your health care provider. Make sure you discuss any questions you have with your health care provider. Document Revised: 06/06/2018 Document Reviewed: 06/06/2018 Elsevier Patient Education  2020 [employer].

## 2020-07-21 ENCOUNTER — Encounter: Payer: Self-pay | Admitting: Family Medicine

## 2020-07-21 ENCOUNTER — Ambulatory Visit (INDEPENDENT_AMBULATORY_CARE_PROVIDER_SITE_OTHER): Payer: 59 | Admitting: Family Medicine

## 2020-07-21 ENCOUNTER — Other Ambulatory Visit (HOSPITAL_COMMUNITY)
Admission: RE | Admit: 2020-07-21 | Discharge: 2020-07-21 | Disposition: A | Discharge status: Home / Self Care | Payer: 59 | Source: Ambulatory Visit | Attending: Family Medicine | Admitting: Family Medicine

## 2020-07-21 VITALS — BP 130/71 | HR 72 | Resp 16 | Ht 67.0 in | Wt 146.0 lb

## 2020-07-21 DIAGNOSIS — Z01419 Encounter for gynecological examination (general) (routine) without abnormal findings: Secondary | ICD-10-CM

## 2020-07-21 DIAGNOSIS — N920 Excessive and frequent menstruation with regular cycle: Secondary | ICD-10-CM | POA: Diagnosis not present

## 2020-07-21 MED ORDER — NORGESTIMATE-ETH ESTRADIOL 0.25-35 MG-MCG PO TABS: 1.0000 | ORAL_TABLET | Freq: Every day | ORAL | 11 refills | Status: DC

## 2020-07-21 NOTE — Progress Notes (Signed)
GYNECOLOGY ANNUAL PREVENTATIVE CARE ENCOUNTER NOTE  Subjective:   Gina Baldwin is a 44 y.o. G1P1 female here for a routine annual gynecologic exam.  Current complaints: Occasional hot flashes, night sweats. Some mood irritability. Has ADHD and bipolar.   Denies abnormal vaginal bleeding, discharge, pelvic pain, problems with intercourse or other gynecologic concerns.    Menses slightly irregular. Has heavy periods.   Gynecologic History Patient's last menstrual period was 06/28/2020. Contraception: tubal ligation Last Pap: 2018. Results were: normal Last mammogram: 05/2020. Results were: normal  Obstetric History OB History  Gravida Para Term Preterm AB Living  1 1          SAB TAB Ectopic Multiple Live Births          1    # Outcome Date GA Lbr Len/2nd Weight Sex Delivery Anes PTL Lv  1 Para      Vag-Spont       Past Medical History:  Diagnosis Date  . ADHD   . ADHD   . Bipolar disorder (HCC)   . Complication of anesthesia    slow to wake up    Past Surgical History:  Procedure Laterality Date  . ORIF CLAVICULAR FRACTURE Right 10/11/2017   Procedure: PEN REDUCTION INTERNAL FIXATION OF CLAVICLE FRACTURE WITH ACROMIOCLAVICULAR RECONSTRUCTION;  Surgeon: Jones Broom, MD;  Location: [facility] OR;  Service: Orthopedics;  Laterality: Right;  . tubaligation    . WISDOM TOOTH EXTRACTION      Current Outpatient Medications on File Prior to Visit  Medication Sig Dispense Refill  . ADDERALL XR 20 MG 24 hr capsule Take 40 mg by mouth daily.  0  . amphetamine-dextroamphetamine (ADDERALL) 20 MG tablet Take 20 mg by mouth daily as needed. For additional focus after 3 pm.  0  . lamoTRIgine (LAMICTAL) 150 MG tablet Take 150 mg by mouth every evening.  12   No current facility-administered medications on file prior to visit.    Allergies  Allergen Reactions  . Food Anaphylaxis, Swelling and Other (See Comments)    Mushrooms-throat closes/swelling  . Chocolate Flavor Other (See  Comments)    Other  . Oxycodone-Acetaminophen Nausea And Vomiting    Social History   Socioeconomic History  . Marital status: Married    Spouse name: Not on file  . Number of children: Not on file  . Years of education: Not on file  . Highest education level: Not on file  Occupational History  . Not on file  Tobacco Use  . Smoking status: Former Games developer  . Smokeless tobacco: Never Used  Vaping Use  . Vaping Use: Some days  . Substances: Flavoring  Substance and Sexual Activity  . Alcohol use: Yes    Comment: occasional  . Drug use: No  . Sexual activity: Yes    Birth control/protection: Surgical  Other Topics Concern  . Not on file  Social History Narrative  . Not on file   Social Determinants of Health   Financial Resource Strain:   . Difficulty of Paying Living Expenses: Not on file  Food Insecurity:   . Worried About Programme researcher, broadcasting/film/video in the Last Year: Not on file  . Ran Out of Food in the Last Year: Not on file  Transportation Needs:   . Lack of Transportation (Medical): Not on file  . Lack of Transportation (Non-Medical): Not on file  Physical Activity:   . Days of Exercise per Week: Not on file  . Minutes of Exercise  per Session: Not on file  Stress:   . Feeling of Stress : Not on file  Social Connections:   . Frequency of Communication with Friends and Family: Not on file  . Frequency of Social Gatherings with Friends and Family: Not on file  . Attends Religious Services: Not on file  . Active Member of Clubs or Organizations: Not on file  . Attends Banker Meetings: Not on file  . Marital Status: Not on file  Intimate Partner Violence:   . Fear of Current or Ex-Partner: Not on file  . Emotionally Abused: Not on file  . Physically Abused: Not on file  . Sexually Abused: Not on file    Family History  Problem Relation Age of Onset  . Osteoporosis Mother   . Osteoarthritis Mother   . Diabetes Maternal Grandmother     The  following portions of the patient's history were reviewed and updated as appropriate: allergies, current medications, past family history, past medical history, past social history, past surgical history and problem list.  Review of Systems Pertinent items are noted in HPI.   Objective:  BP 130/71   Pulse 72   Resp 16   Ht 5\' 7"  (1.702 m)   Wt 146 lb (66.2 kg)   LMP 06/28/2020   BMI 22.87 kg/m  Wt Readings from Last 3 Encounters:  07/21/20 146 lb (66.2 kg)  07/20/20 146 lb (66.2 kg)  10/11/17 127 lb (57.6 kg)     Chaperone present during exam  CONSTITUTIONAL: Well-developed, well-nourished female in no acute distress.  HENT:  Normocephalic, atraumatic, External right and left ear normal. Oropharynx is clear and moist EYES: Conjunctivae and EOM are normal. Pupils are equal, round, and reactive to light. No scleral icterus.  NECK: Normal range of motion, supple, no masses.  Normal thyroid.   CARDIOVASCULAR: Normal heart rate noted, regular rhythm RESPIRATORY: Clear to auscultation bilaterally. Effort and breath sounds normal, no problems with respiration noted. BREASTS: Symmetric in size. No masses, skin changes, nipple drainage, or lymphadenopathy. ABDOMEN: Soft, normal bowel sounds, no distention noted.  No tenderness, rebound or guarding.  PELVIC: Normal appearing external genitalia; normal appearing vaginal mucosa and cervix.  No abnormal discharge noted.  Normal uterine size, no other palpable masses, no uterine or adnexal tenderness. MUSCULOSKELETAL: Normal range of motion. No tenderness.  No cyanosis, clubbing, or edema.  2+ distal pulses. SKIN: Skin is warm and dry. No rash noted. Not diaphoretic. No erythema. No pallor. NEUROLOGIC: Alert and oriented to person, place, and time. Normal reflexes, muscle tone coordination. No cranial nerve deficit noted. PSYCHIATRIC: Normal mood and affect. Normal behavior. Normal judgment and thought content.  Assessment:  Annual  gynecologic examination with pap smear   Plan:  1. Well Woman Exam Will follow up results of pap smear and manage accordingly. Mammogram reviewed  2. Menorrhagia  Start orthocyclen. Discussed potential side effects. F/u in 3 months.  Routine preventative health maintenance measures emphasized. Please refer to After Visit Summary for other counseling recommendations.    12/09/17, DO [facility]

## 2020-07-22 ENCOUNTER — Other Ambulatory Visit: Payer: Self-pay

## 2020-07-22 ENCOUNTER — Other Ambulatory Visit: Payer: 59

## 2020-07-22 DIAGNOSIS — R5383 Other fatigue: Secondary | ICD-10-CM

## 2020-07-22 DIAGNOSIS — R252 Cramp and spasm: Secondary | ICD-10-CM

## 2020-07-22 DIAGNOSIS — Z0000 Encounter for general adult medical examination without abnormal findings: Secondary | ICD-10-CM

## 2020-07-22 DIAGNOSIS — R7989 Other specified abnormal findings of blood chemistry: Secondary | ICD-10-CM

## 2020-07-22 LAB — COMPREHENSIVE METABOLIC PANEL: Total Bilirubin: 0.5 mg/dL (ref 0.2–1.2)

## 2020-07-22 LAB — LIPID PANEL
HDL: 70 mg/dL (ref >=50)
Total CHOL/HDL Ratio: 3 (calc) (ref <5.0)

## 2020-07-22 LAB — CBC WITH DIFFERENTIAL/PLATELET
Eosinophils Absolute: 218 cells/uL (ref 15–500)
HCT: 40.9 % (ref 35.0–45.0)
RBC: 4.24 10*6/uL (ref 3.80–5.10)
Total Lymphocyte: 28.7 %

## 2020-07-22 LAB — MAGNESIUM: Magnesium: 2.2 mg/dL (ref 1.5–2.5)

## 2020-07-23 LAB — COMPREHENSIVE METABOLIC PANEL: BUN: 12 mg/dL (ref 7–25)

## 2020-07-23 LAB — CBC WITH DIFFERENTIAL/PLATELET: WBC: 5.2 10*3/uL (ref 3.8–10.8)

## 2020-07-26 LAB — COMPREHENSIVE METABOLIC PANEL
AG Ratio: 2 (calc) (ref 1.0–2.5)
ALT: 10 U/L (ref 6–29)
AST: 16 U/L (ref 10–30)
Albumin: 4.5 g/dL (ref 3.6–5.1)
Alkaline phosphatase (APISO): 42 U/L (ref 31–125)
CO2: 24 mmol/L (ref 20–32)
Calcium: 9.6 mg/dL (ref 8.6–10.2)
Chloride: 103 mmol/L (ref 98–110)
Creat: 0.76 mg/dL (ref 0.50–1.10)
Globulin: 2.3 g/dL (calc) (ref 1.9–3.7)
Glucose, Bld: 93 mg/dL (ref 65–99)
Potassium: 4.2 mmol/L (ref 3.5–5.3)
Sodium: 136 mmol/L (ref 135–146)
Total Protein: 6.8 g/dL (ref 6.1–8.1)

## 2020-07-26 LAB — CBC WITH DIFFERENTIAL/PLATELET
Absolute Monocytes: 588 cells/uL (ref 200–950)
Basophils Absolute: 109 cells/uL (ref 0–200)
Basophils Relative: 2.1 %
Eosinophils Relative: 4.2 %
Hemoglobin: 13.9 g/dL (ref 11.7–15.5)
Lymphs Abs: 1492 cells/uL (ref 850–3900)
MCH: 32.8 pg (ref 27.0–33.0)
MCHC: 34 g/dL (ref 32.0–36.0)
MCV: 96.5 fL (ref 80.0–100.0)
MPV: 10.2 fL (ref 7.5–12.5)
Monocytes Relative: 11.3 %
Neutro Abs: 2792 cells/uL (ref 1500–7800)
Neutrophils Relative %: 53.7 %
Platelets: 293 10*3/uL (ref 140–400)
RDW: 11.5 % (ref 11.0–15.0)

## 2020-07-26 LAB — LIPID PANEL
Cholesterol: 211 mg/dL — ABNORMAL HIGH (ref <200)
LDL Cholesterol (Calc): 121 mg/dL (calc) — ABNORMAL HIGH
Non-HDL Cholesterol (Calc): 141 mg/dL (calc) — ABNORMAL HIGH (ref <130)
Triglycerides: 92 mg/dL (ref <150)

## 2020-07-26 LAB — CYTOLOGY - PAP
Comment: NEGATIVE
Diagnosis: NEGATIVE
High risk HPV: NEGATIVE

## 2020-07-26 LAB — TSH: TSH: 2.52 mIU/L

## 2020-07-26 LAB — VITAMIN B1: Vitamin B1 (Thiamine): 11 nmol/L (ref 8–30)

## 2020-07-26 LAB — VITAMIN D 1,25 DIHYDROXY
Vitamin D 1, 25 (OH)2 Total: 32 pg/mL (ref 18–72)
Vitamin D2 1, 25 (OH)2: <8 pg/mL
Vitamin D3 1, 25 (OH)2: 32 pg/mL

## 2020-07-26 LAB — T4, FREE: Free T4: 1.2 ng/dL (ref 0.8–1.8)

## 2020-07-26 LAB — VITAMIN B12: Vitamin B-12: >2000 pg/mL — ABNORMAL HIGH (ref 200–1100)

## 2021-06-08 ENCOUNTER — Other Ambulatory Visit: Payer: Self-pay

## 2021-06-08 ENCOUNTER — Other Ambulatory Visit (HOSPITAL_BASED_OUTPATIENT_CLINIC_OR_DEPARTMENT_OTHER): Payer: Self-pay | Admitting: Medical

## 2021-06-08 ENCOUNTER — Ambulatory Visit (INDEPENDENT_AMBULATORY_CARE_PROVIDER_SITE_OTHER): Payer: 59 | Admitting: Medical

## 2021-06-08 VITALS — BP 120/80 | HR 73 | Temp 98.4°F | Resp 18 | Ht 67.0 in | Wt 157.6 lb

## 2021-06-08 DIAGNOSIS — R635 Abnormal weight gain: Secondary | ICD-10-CM | POA: Diagnosis not present

## 2021-06-08 DIAGNOSIS — F319 Bipolar disorder, unspecified: Secondary | ICD-10-CM | POA: Diagnosis not present

## 2021-06-08 DIAGNOSIS — R0683 Snoring: Secondary | ICD-10-CM

## 2021-06-08 DIAGNOSIS — R5383 Other fatigue: Secondary | ICD-10-CM | POA: Diagnosis not present

## 2021-06-08 DIAGNOSIS — F909 Attention-deficit hyperactivity disorder, unspecified type: Secondary | ICD-10-CM

## 2021-06-08 DIAGNOSIS — Z1231 Encounter for screening mammogram for malignant neoplasm of breast: Secondary | ICD-10-CM

## 2021-06-08 DIAGNOSIS — E010 Iodine-deficiency related diffuse (endemic) goiter: Secondary | ICD-10-CM

## 2021-06-08 DIAGNOSIS — R232 Flushing: Secondary | ICD-10-CM

## 2021-06-08 NOTE — Patient Instructions (Addendum)
Recent weight gain and fatigue could be perimenopausal related . Discuss with your gyn on major change.   For hot flashes included fsh on labs.  Can try weight watchers app and exercise.   Will get tsh, t4, b12, b1 and vit D.  For snoring refer to pulmonologist for possible sleep study.  For possible enlarged thyroid get thyroid US.  For ADD and bipolar continue to follow up with psychiatrist.  Follow up 3-4 weeks or sooner if needed.

## 2021-06-08 NOTE — Progress Notes (Signed)
Subjective:    Patient ID: Gina Baldwin, female    DOB: May 06, 1976, 45 y.o.   MRN: [id number]  HPI  Pt in for evaluation.  Pt mentions that she saw her gyn and she states gyn told her she is perimenopausal. Pt given ortho-tricyclen. She used briefly but then stopped due to below signs/symptoms. She stopped this med but her symptoms did not improve. Pt does feel some brain fog. Pt has had tubes tied. Pt last menstrual cycle about one month ago. She has not skipped any cycles. But menses will vary between 25-30 days. Mom menopause at 26 yo.    Pt feels like gaining weight. She thinks feels bloated and neck feels thicker. Pt describes eating healthy. But eats some unhealthy on weekends. Pt cutting back on alcohol. Planning no alcohol in September. Pt swims laps. Walks her dog.  Pt states does not sleep regularly. States will sleep about 8 hours. Will wake up 3 am in morning feeling hot and restless. Woke up this morning soaking wet. Sweating at night 1-2 times a week. Pt recently dropped her thermostat to 68 at night. 2 months ago she had set at 74.   Pt has lack of focus/ADD and bipolor. Pt sees Dr. Evelene Croon on regular basis.  Pt states work is going well.  Review of Systems  Constitutional:  Negative for chills, fatigue and fever.  HENT:  Negative for dental problem.        Recent snorning.  Respiratory:  Negative for cough, chest tightness, shortness of breath and wheezing.   Gastrointestinal:  Negative for abdominal pain, constipation, diarrhea and nausea.  Genitourinary:  Negative for dysuria, enuresis and flank pain.  Musculoskeletal:  Negative for back pain and myalgias.  Skin:  Negative for rash.  Neurological:  Negative for dizziness, seizures, speech difficulty, weakness and headaches.  Hematological:  Negative for adenopathy. Does not bruise/bleed easily.  Psychiatric/Behavioral:  Negative for behavioral problems, decreased concentration, hallucinations and sleep disturbance.  The patient is not nervous/anxious.      Past Medical History:  Diagnosis Date   ADHD    ADHD    Bipolar disorder (HCC)    Complication of anesthesia    slow to wake up     Social History   Socioeconomic History   Marital status: Married    Spouse name: Not on file   Number of children: Not on file   Years of education: Not on file   Highest education level: Not on file  Occupational History   Not on file  Tobacco Use   Smoking status: Former   Smokeless tobacco: Never  Vaping Use   Vaping Use: Some days   Substances: Flavoring  Substance and Sexual Activity   Alcohol use: Yes    Comment: occasional   Drug use: No   Sexual activity: Yes    Birth control/protection: Surgical  Other Topics Concern   Not on file  Social History Narrative   Not on file   Social Determinants of Health   Financial Resource Strain: Not on file  Food Insecurity: Not on file  Transportation Needs: Not on file  Physical Activity: Not on file  Stress: Not on file  Social Connections: Not on file  Intimate Partner Violence: Not on file    Past Surgical History:  Procedure Laterality Date   ORIF CLAVICULAR FRACTURE Right 10/11/2017   Procedure: PEN REDUCTION INTERNAL FIXATION OF CLAVICLE FRACTURE WITH ACROMIOCLAVICULAR RECONSTRUCTION;  Surgeon: Jones Broom, MD;  Location: [facility]  OR;  Service: Orthopedics;  Laterality: Right;   tubaligation     WISDOM TOOTH EXTRACTION      Family History  Problem Relation Age of Onset   Osteoporosis Mother    Osteoarthritis Mother    Diabetes Maternal Grandmother     Allergies  Allergen Reactions   Food Anaphylaxis, Swelling and Other (See Comments)    Mushrooms-throat closes/swelling   Chocolate Flavor Other (See Comments)    Other   Oxycodone-Acetaminophen Nausea And Vomiting    Current Outpatient Medications on File Prior to Visit  Medication Sig Dispense Refill   ADDERALL XR 20 MG 24 hr capsule Take 40 mg by mouth daily.  0   VRAYLAR  1.5 MG capsule Take 1.5 mg by mouth daily.     No current facility-administered medications on file prior to visit.    BP 120/80 (BP Location: Left Arm, Patient Position: Sitting, Cuff Size: Normal)   Pulse 73   Temp 98.4 F (36.9 C) (Oral)   Resp 18   Ht 5\' 7"  (1.702 m)   Wt 157 lb 9.6 oz (71.5 kg)   SpO2 100%   BMI 24.68 kg/m        Objective:   Physical Exam  General Mental Status- Alert. General Appearance- Not in acute distress.   Skin General: Color- Normal Color. Moisture- Normal Moisture.  Neck Possible mild thyromegaly. Maybe larger rt side.  Chest and Lung Exam Auscultation: Breath Sounds:-Normal.  Cardiovascular Auscultation:Rythm- Regular. Murmurs & Other Heart Sounds:Auscultation of the heart reveals- No Murmurs.  Abdomen Inspection:-Inspeection Normal. Palpation/Percussion:Note:No mass. Palpation and Percussion of the abdomen reveal- Non Tender, Non Distended + BS, no rebound or guarding.   Neurologic Cranial Nerve exam:- CN III-XII intact(No nystagmus), symmetric smile. Strength:- 5/5 equal and symmetric strength both upper and lower extremities.       Assessment & Plan:   Patient Instructions  Recent weight gain and fatigue. Possibly perimenopausal weight gain. Discuss with your gyn on major change.   Can try weight watchers app and exercise.   Will get tsh, t4, b12, b1 and vit D.  For snoring refer to pulmonologist for possible sleep study.  For possible enlarged thyroid get thyroid .  Follow up 3-4 weeks or sooner if needed.       Time spent with patient today was  41 minutes which consisted of chart revdiew, discussing diagnosis, work up treatment and documentation. (Discussion and education on menopause and weight loss efforts)

## 2021-06-15 ENCOUNTER — Other Ambulatory Visit: Payer: Self-pay

## 2021-06-15 ENCOUNTER — Ambulatory Visit (HOSPITAL_BASED_OUTPATIENT_CLINIC_OR_DEPARTMENT_OTHER)
Admission: RE | Admit: 2021-06-15 | Discharge: 2021-06-15 | Disposition: A | Discharge status: Home / Self Care | Payer: 59 | Source: Ambulatory Visit | Attending: Medical | Admitting: Medical

## 2021-06-15 DIAGNOSIS — E010 Iodine-deficiency related diffuse (endemic) goiter: Secondary | ICD-10-CM | POA: Insufficient documentation

## 2021-06-18 NOTE — Progress Notes (Deleted)
06/20/21- 45 yoF former smoker for sleep evaluation with concern of snoring ccourtesy of Esperanza Richters, PA-C Medical problem list includes Bipolar Epworth score- Body weight today- Covid vax-

## 2021-06-20 ENCOUNTER — Institutional Professional Consult (permissible substitution): Payer: 59 | Admitting: Internal Medicine

## 2021-06-21 ENCOUNTER — Other Ambulatory Visit: Payer: Self-pay

## 2021-06-21 ENCOUNTER — Encounter (HOSPITAL_BASED_OUTPATIENT_CLINIC_OR_DEPARTMENT_OTHER): Payer: Self-pay

## 2021-06-21 ENCOUNTER — Ambulatory Visit (HOSPITAL_BASED_OUTPATIENT_CLINIC_OR_DEPARTMENT_OTHER)
Admission: RE | Admit: 2021-06-21 | Discharge: 2021-06-21 | Disposition: A | Discharge status: Home / Self Care | Payer: 59 | Source: Ambulatory Visit | Attending: Medical | Admitting: Medical

## 2021-06-21 DIAGNOSIS — Z1231 Encounter for screening mammogram for malignant neoplasm of breast: Secondary | ICD-10-CM | POA: Insufficient documentation

## 2021-06-29 ENCOUNTER — Ambulatory Visit: Payer: 59 | Admitting: Medical

## 2021-06-29 NOTE — Progress Notes (Deleted)
   Subjective:    Patient ID: Gina Baldwin, female    DOB: 1976-05-11, 45 y.o.   MRN: [id number]  HPI    Review of Systems     Objective:   Physical Exam        Assessment & Plan:

## 2021-07-25 ENCOUNTER — Institutional Professional Consult (permissible substitution): Payer: 59 | Admitting: Pulmonary Disease

## 2021-10-01 IMAGING — MG DIGITAL SCREENING BILAT W/ TOMO W/ CAD
8 series · 9 of 24 positions shown · non-contrast
Comparison: Previous exam(s).

CLINICAL DATA: Screening. History of breast cysts.

EXAM:
DIGITAL SCREENING BILATERAL MAMMOGRAM WITH TOMO AND CAD

[R CC synth-2D]
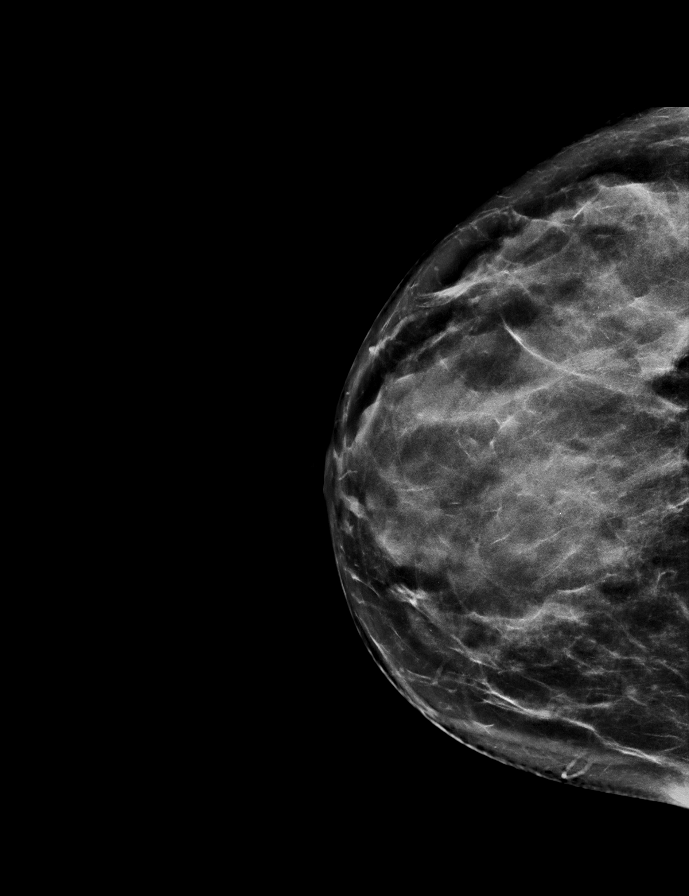

[R MLO synth-2D]
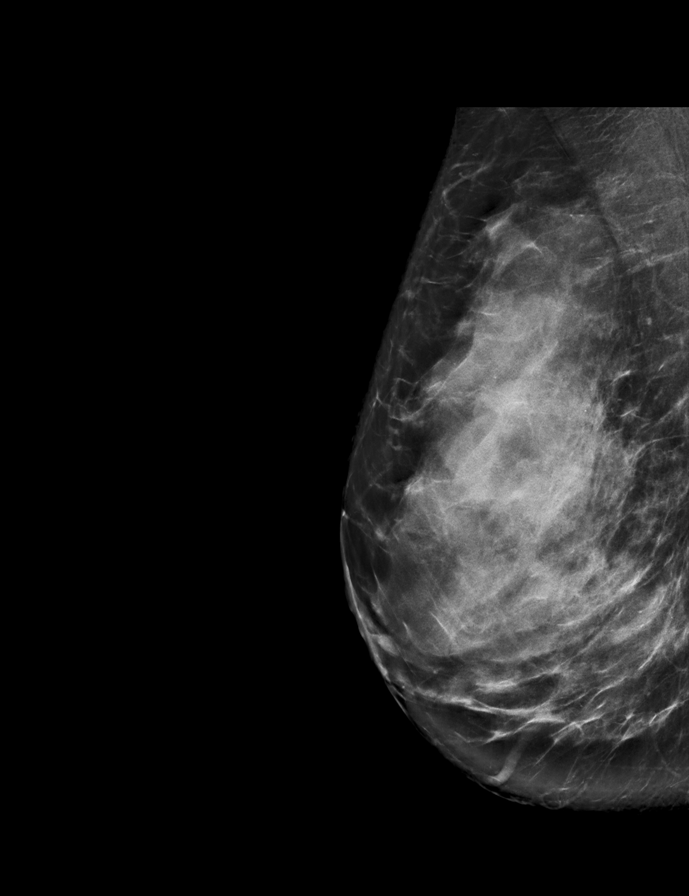

[L CC synth-2D]
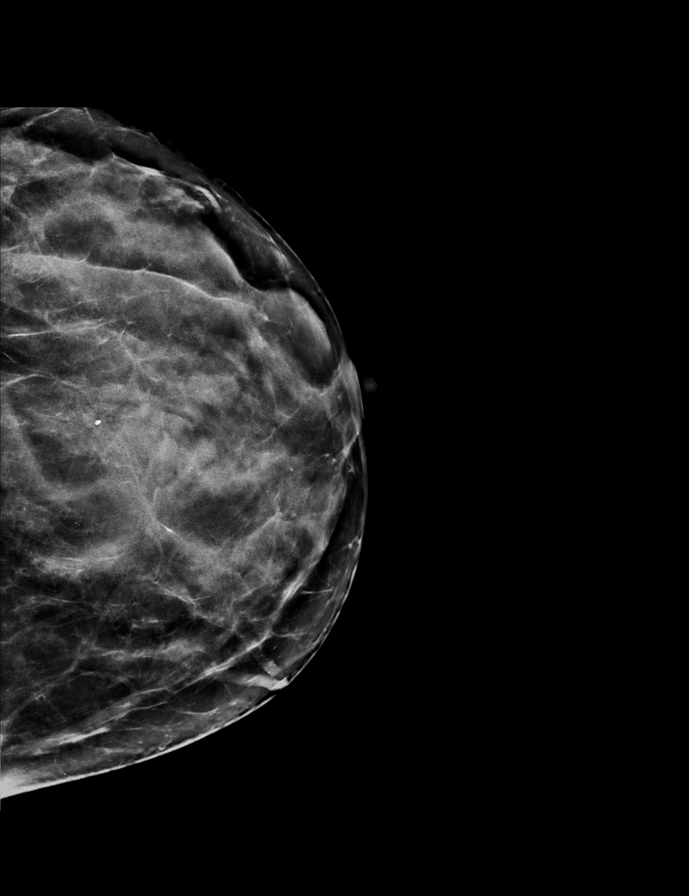

[L MLO synth-2D]
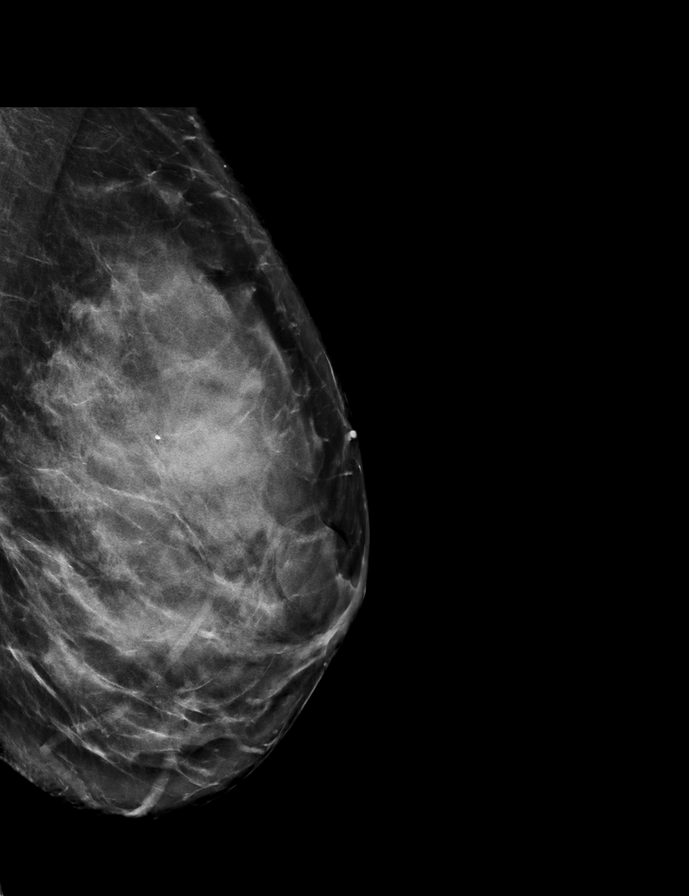

[R MLO tomo · 2 of 80 frames shown]
[frame 26/80]
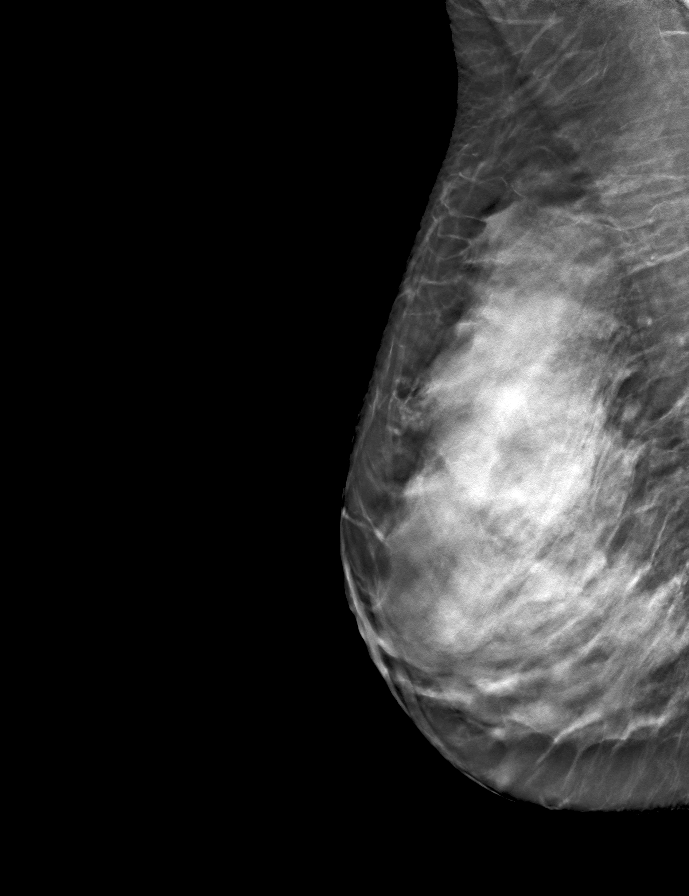
[frame 41/80]
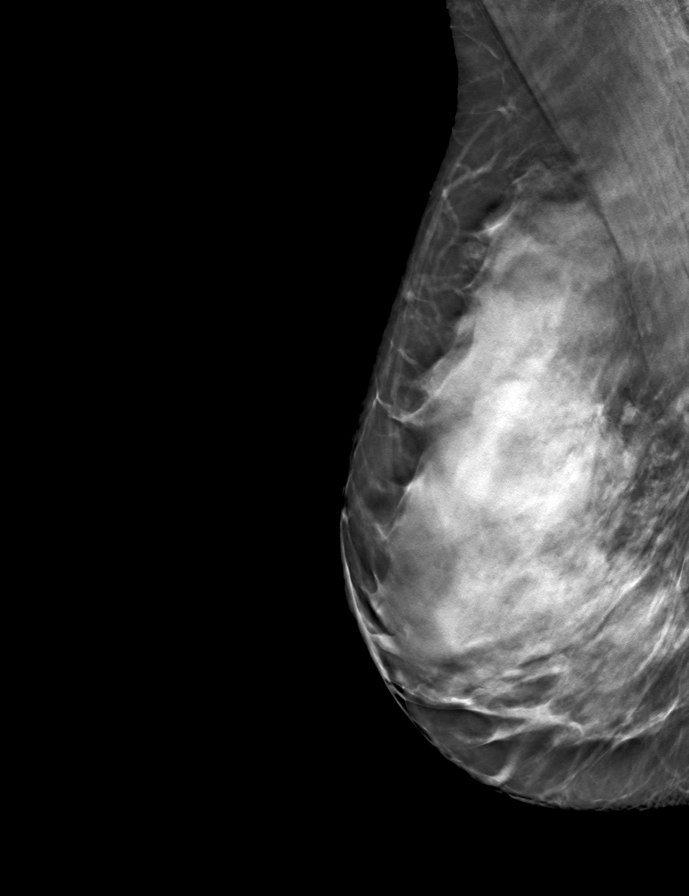

[L MLO tomo · tomo slice 39/76.0]
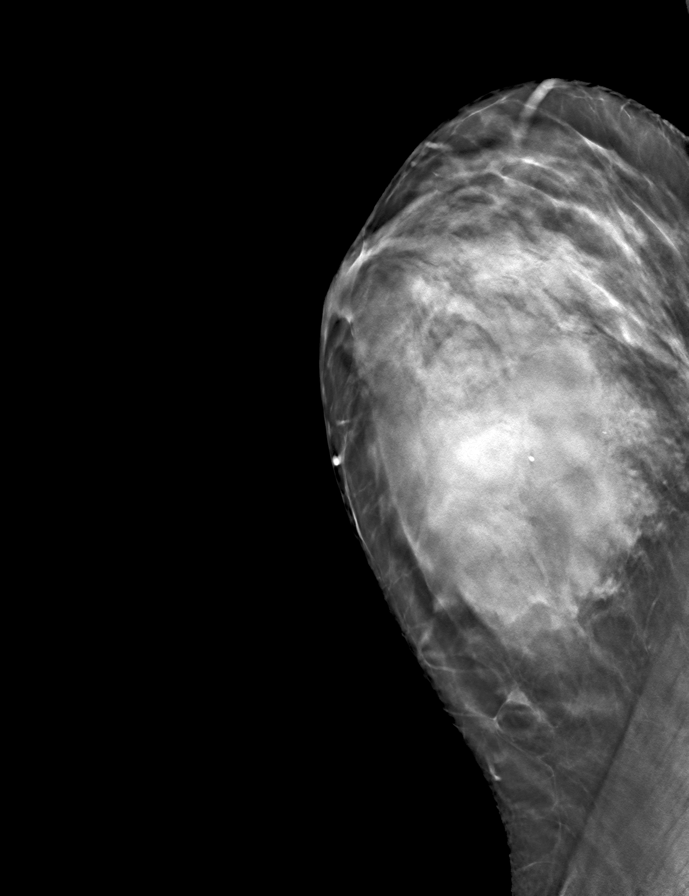

[R CC tomo · tomo slice 36/71.0]
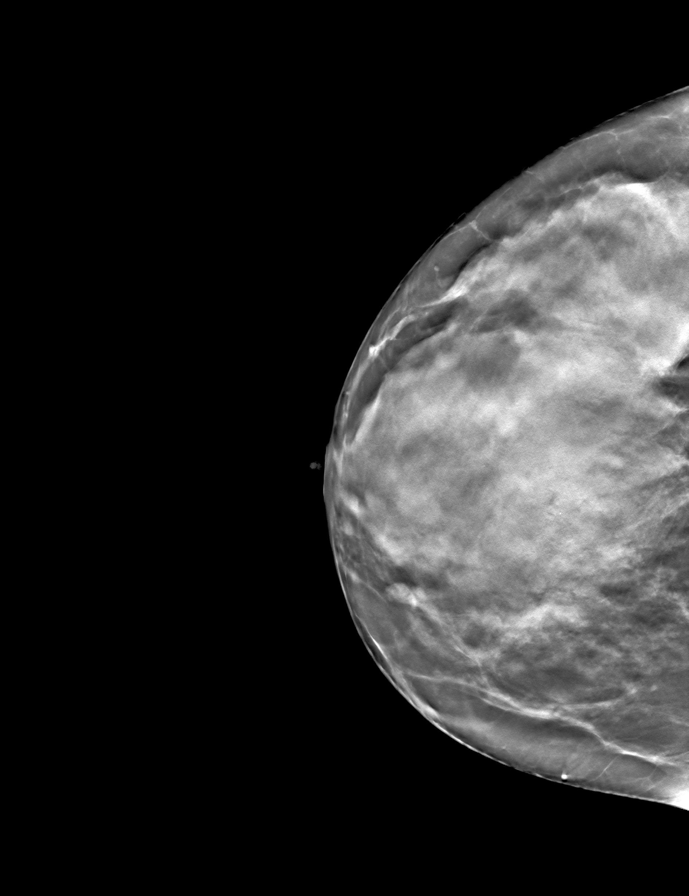

[L CC tomo · tomo slice 33/65.0]
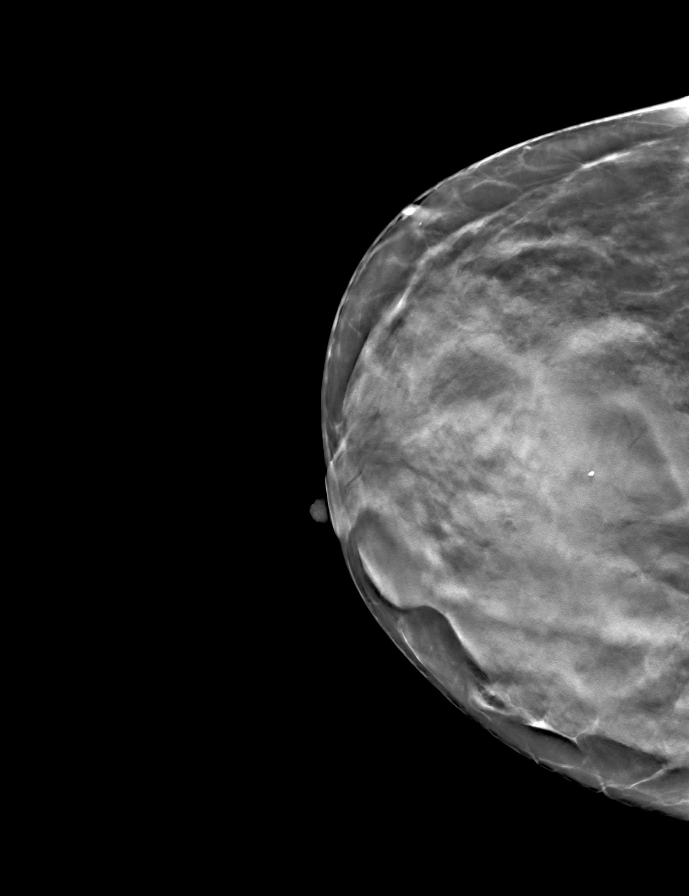

[9 of 24 positions shown; findings below may reference images not displayed]

ACR Breast Density Category d: The breast tissue is extremely dense,
which lowers the sensitivity of mammography
FINDINGS: There are no findings suspicious for malignancy. Images were
processed with CAD.
IMPRESSION: No mammographic evidence of malignancy. A result letter of this
screening mammogram will be mailed directly to the patient.

RECOMMENDATION:
Screening mammogram in one year. (Code:G2-N-DYZ)

BI-RADS CATEGORY  1: Negative.

## 2022-03-24 ENCOUNTER — Emergency Department (HOSPITAL_BASED_OUTPATIENT_CLINIC_OR_DEPARTMENT_OTHER)
Admission: EM | Admit: 2022-03-24 | Discharge: 2022-03-24 | Disposition: A | Discharge status: Home / Self Care | Payer: Commercial Managed Care - HMO | Attending: Emergency Medicine | Admitting: Emergency Medicine

## 2022-03-24 ENCOUNTER — Other Ambulatory Visit: Payer: Self-pay

## 2022-03-24 ENCOUNTER — Emergency Department (HOSPITAL_BASED_OUTPATIENT_CLINIC_OR_DEPARTMENT_OTHER): Payer: Commercial Managed Care - HMO

## 2022-03-24 ENCOUNTER — Encounter (HOSPITAL_BASED_OUTPATIENT_CLINIC_OR_DEPARTMENT_OTHER): Payer: Self-pay | Admitting: Emergency Medicine

## 2022-03-24 DIAGNOSIS — R0789 Other chest pain: Secondary | ICD-10-CM | POA: Diagnosis present

## 2022-03-24 DIAGNOSIS — M940 Chondrocostal junction syndrome [Tietze]: Secondary | ICD-10-CM

## 2022-03-24 LAB — CBC
HCT: 41.7 % (ref 36.0–46.0)
Hemoglobin: 14.3 g/dL (ref 12.0–15.0)
MCH: 32.1 pg (ref 26.0–34.0)
MCHC: 34.3 g/dL (ref 30.0–36.0)
MCV: 93.5 fL (ref 80.0–100.0)
Platelets: 362 10*3/uL (ref 150–400)
RBC: 4.46 MIL/uL (ref 3.87–5.11)
RDW: 13 % (ref 11.5–15.5)
WBC: 9 10*3/uL (ref 4.0–10.5)
nRBC: 0 % (ref 0.0–0.2)

## 2022-03-24 LAB — D-DIMER, QUANTITATIVE: D-Dimer, Quant: 0.38 ug/mL-FEU (ref 0.00–0.50)

## 2022-03-24 LAB — BASIC METABOLIC PANEL
Anion gap: 7 (ref 5–15)
BUN: 10 mg/dL (ref 6–20)
CO2: 21 mmol/L — ABNORMAL LOW (ref 22–32)
Calcium: 8.7 mg/dL — ABNORMAL LOW (ref 8.9–10.3)
Chloride: 113 mmol/L — ABNORMAL HIGH (ref 98–111)
Creatinine, Ser: 0.79 mg/dL (ref 0.44–1.00)
GFR, Estimated: >60 mL/min (ref >60)
Glucose, Bld: 103 mg/dL — ABNORMAL HIGH (ref 70–99)
Potassium: 3.9 mmol/L (ref 3.5–5.1)
Sodium: 141 mmol/L (ref 135–145)

## 2022-03-24 LAB — TROPONIN I (HIGH SENSITIVITY): Troponin I (High Sensitivity): 4 ng/L (ref <18)

## 2022-03-24 MED ORDER — KETOROLAC TROMETHAMINE 30 MG/ML IJ SOLN
30.0000 mg | Freq: Once | INTRAMUSCULAR | Status: AC
  Administered 2022-03-24: 30 mg via INTRAVENOUS
  Filled 2022-03-24: qty 1

## 2022-03-24 NOTE — Discharge Instructions (Signed)
Take ibuprofen 600 mg 3 times daily for the next 5 days.  Rest.  Return to the emergency department if you develop worsening pain, high fevers, difficulty breathing, or other new and concerning symptoms.

## 2022-03-24 NOTE — ED Triage Notes (Signed)
Left sided chest pain X 1 day worse when taking a deep breath.

## 2022-03-24 NOTE — ED Provider Notes (Signed)
[facility] EMERGENCY DEPARTMENT Provider Note   CSN: [id number] Arrival date & time: 03/24/22  0093     History  Chief Complaint  Patient presents with   Chest Pain    Gina Baldwin is a 46 y.o. female.  Patient is a 46 year old female with past medical history of ADHD.  Patient presenting today with complaints of chest discomfort.  She describes a sharp pain to the left upper chest that began this morning in the absence of any injury or trauma.  The pain is worse when she takes a deep breath.  She denies any shortness of breath, nausea, diaphoresis, radiation to the arm or jaw.  Patient exercises regularly and denies any recent exertional symptoms.  She denies any fevers, chills, or cough.  Patient has no cardiac risk factors.  The history is provided by the patient.       Home Medications Prior to Admission medications   Medication Sig Start Date End Date Taking? Authorizing Provider  ADDERALL XR 20 MG 24 hr capsule Take 40 mg by mouth daily. 09/27/17   [provider]  VRAYLAR 1.5 MG capsule Take 1.5 mg by mouth daily. 05/23/21   [provider]      Allergies    Food, Chocolate flavor, and Oxycodone-acetaminophen    Review of Systems   Review of Systems  All other systems reviewed and are negative.   Physical Exam Updated Vital Signs BP (!) 146/98   Pulse 92   Resp 18   Ht 5\' 7"  (1.702 m)   Wt 72.6 kg   LMP 03/08/2022 (Approximate)   SpO2 95%   BMI 25.06 kg/m  Physical Exam Vitals and nursing note reviewed.  Constitutional:      General: She is not in acute distress.    Appearance: She is well-developed. She is not diaphoretic.  HENT:     Head: Normocephalic and atraumatic.  Cardiovascular:     Rate and Rhythm: Normal rate and regular rhythm.     Heart sounds: No murmur heard.    No friction rub. No gallop.  Pulmonary:     Effort: Pulmonary effort is normal. No respiratory distress.     Breath sounds: Normal breath  sounds. No wheezing.  Abdominal:     General: Bowel sounds are normal. There is no distension.     Palpations: Abdomen is soft.     Tenderness: There is no abdominal tenderness.  Musculoskeletal:        General: Normal range of motion.     Cervical back: Normal range of motion and neck supple.     Right lower leg: No tenderness. No edema.     Left lower leg: No tenderness. No edema.  Skin:    General: Skin is warm and dry.  Neurological:     General: No focal deficit present.     Mental Status: She is alert and oriented to person, place, and time.     ED Results / Procedures / Treatments   Labs (all labs ordered are listed, but only abnormal results are displayed) Labs Reviewed  BASIC METABOLIC PANEL - Abnormal; Notable for the following components:      Result Value   Chloride 113 (*)    CO2 21 (*)    Glucose, Bld 103 (*)    Calcium 8.7 (*)    All other components within normal limits  CBC  PREGNANCY, URINE  TROPONIN I (HIGH SENSITIVITY)    EKG EKG Interpretation  Date/Time:  Friday March 24 2022 02:50:55 EDT Ventricular Rate:  86 PR Interval:  149 QRS Duration: 102 QT Interval:  372 QTC Calculation: 445 R Axis:   -33 Text Interpretation: Sinus rhythm Left axis deviation Low voltage, precordial leads RSR' in V1 or V2, probably normal variant Confirmed by Geoffery Lyons ([id number]) on 03/24/2022 3:10:35 AM  Radiology DG Chest 2 View  Result Date: 03/24/2022 CLINICAL DATA:  Left-sided chest pain EXAM: CHEST - 2 VIEW COMPARISON:  None Available. FINDINGS: The heart size and mediastinal contours are within normal limits. Both lungs are clear. The visualized skeletal structures are unremarkable. IMPRESSION: No active cardiopulmonary disease. Electronically Signed   By: Elgie Collard M.D.   On: 03/24/2022 03:10    Procedures Procedures    Medications Ordered in ED Medications  ketorolac (TORADOL) 30 MG/ML injection 30 mg (has no administration in time range)    ED  Course/ Medical Decision Making/ A&P  This patient presents to the ED for concern of chest pain, this involves an extensive number of treatment options, and is a complaint that carries with it a high risk of complications and morbidity.  The differential diagnosis includes acute coronary syndrome, pulmonary embolism, costochondritis, aortic dissection, pneumothorax   Co morbidities that complicate the patient evaluation  None   Additional history obtained:  No additional history or external records needed   Lab Tests:  I Ordered, and personally interpreted labs.  The pertinent results include: Unremarkable CBC, metabolic panel, troponin, and D-dimer   Imaging Studies ordered:  I ordered imaging studies including chest x-ray I independently visualized and interpreted imaging which showed no acute process I agree with the radiologist interpretation   Cardiac Monitoring: / EKG:  The patient was maintained on a cardiac monitor.  I personally viewed and interpreted the cardiac monitored which showed an underlying rhythm of: Sinus   Consultations Obtained:  No consultations needed   Problem List / ED Course / Critical interventions / Medication management  Patient presenting with sharp pain to the left upper chest since earlier this morning.  The pain is worse when she breathes and palpates.  Symptoms are atypical for cardiac pain and I highly suspect a musculoskeletal etiology.  Patient given Toradol and is feeling better.  She will be discharged with NSAIDs and as needed return I have reviewed the patients home medicines and have made adjustments as needed   Social Determinants of Health:  None   Test / Admission - Considered:  Patient to be discharged with outpatient follow-up and return as needed if symptoms worsen.   Final Clinical Impression(s) / ED Diagnoses Final diagnoses:  None    Rx / DC Orders ED Discharge Orders     None         Geoffery Lyons,  MD 03/24/22 [phone]

## 2022-05-10 ENCOUNTER — Other Ambulatory Visit (HOSPITAL_BASED_OUTPATIENT_CLINIC_OR_DEPARTMENT_OTHER): Payer: Self-pay | Admitting: Medical

## 2022-05-10 DIAGNOSIS — Z1231 Encounter for screening mammogram for malignant neoplasm of breast: Secondary | ICD-10-CM

## 2022-06-27 ENCOUNTER — Encounter (HOSPITAL_BASED_OUTPATIENT_CLINIC_OR_DEPARTMENT_OTHER): Payer: Self-pay

## 2022-06-27 ENCOUNTER — Ambulatory Visit (HOSPITAL_BASED_OUTPATIENT_CLINIC_OR_DEPARTMENT_OTHER)
Admission: RE | Admit: 2022-06-27 | Discharge: 2022-06-27 | Disposition: A | Discharge status: Home / Self Care | Payer: Commercial Managed Care - HMO | Source: Ambulatory Visit | Attending: Medical | Admitting: Medical

## 2022-06-27 DIAGNOSIS — Z1231 Encounter for screening mammogram for malignant neoplasm of breast: Secondary | ICD-10-CM | POA: Insufficient documentation

## 2022-06-29 ENCOUNTER — Other Ambulatory Visit: Payer: Self-pay | Admitting: Medical

## 2022-06-29 DIAGNOSIS — R928 Other abnormal and inconclusive findings on diagnostic imaging of breast: Secondary | ICD-10-CM

## 2022-07-07 ENCOUNTER — Ambulatory Visit
Admission: RE | Admit: 2022-07-07 | Discharge: 2022-07-07 | Disposition: A | Discharge status: Home / Self Care | Payer: Commercial Managed Care - HMO | Source: Ambulatory Visit | Attending: Medical | Admitting: Medical

## 2022-07-07 DIAGNOSIS — R928 Other abnormal and inconclusive findings on diagnostic imaging of breast: Secondary | ICD-10-CM

## 2022-07-27 ENCOUNTER — Ambulatory Visit: Payer: Commercial Managed Care - HMO | Admitting: Physical Therapy

## 2022-07-27 NOTE — Therapy (Deleted)
OUTPATIENT PHYSICAL THERAPY FEMALE PELVIC EVALUATION   Patient Name: Gina Baldwin MRN: [id number] DOB:January 13, 1976, 46 y.o., female Today's Date: 07/27/2022    Past Medical History:  Diagnosis Date   ADHD    ADHD    Bipolar disorder ([place])    Complication of anesthesia    slow to wake up   Past Surgical History:  Procedure Laterality Date   ORIF CLAVICULAR FRACTURE Right 10/11/2017   Procedure: PEN REDUCTION INTERNAL FIXATION OF CLAVICLE FRACTURE WITH ACROMIOCLAVICULAR RECONSTRUCTION;  Surgeon: Tania Ade, MD;  Location: [facility];  Service: Orthopedics;  Laterality: Right;   tubaligation     WISDOM TOOTH EXTRACTION     There are no problems to display for this patient.   PCP:   Elise Benne    REFERRING PROVIDER: Donnel Saxon, CNM  REFERRING DIAG: M62.9 (ICD-10-CM) - Disorder of muscle, unspecified  THERAPY DIAG:  No diagnosis found.  Rationale for Evaluation and Treatment Rehabilitation  ONSET DATE: ***  SUBJECTIVE:                                                                                                                                                                                           SUBJECTIVE STATEMENT: ***SUI Fluid intake: {Yes/No:304960894}    PAIN:  Are you having pain? {yes/no:20286} NPRS scale: ***/10 Pain location: {pelvic pain location:27098}  Pain type: {type:313116} Pain description: {PAIN DESCRIPTION:21022940}   Aggravating factors: *** Relieving factors: ***  PRECAUTIONS: {Therapy precautions:24002}  WEIGHT BEARING RESTRICTIONS {Yes ***/No:24003}  FALLS:  Has patient fallen in last 6 months? {fallsyesno:27318}  LIVING ENVIRONMENT: Lives with: {[facility] lives with:25569::"lives with their family"} Lives in: {Lives in:25570} Stairs: {opstairs:27293} Has following equipment at home: {Assistive devices:23999}  OCCUPATION: ***  PLOF: {PLOF:24004}  PATIENT GOALS ***  PERTINENT HISTORY:  *** Sexual abuse:  {Yes/No:304960894}  BOWEL MOVEMENT Pain with bowel movement: {yes/no:20286} Type of bowel movement:{PT BM type:27100} Fully empty rectum: {Yes/No:304960894} Leakage: {Yes/No:304960894} Pads: {Yes/No:304960894} Fiber supplement: {Yes/No:304960894}  URINATION Pain with urination: {yes/no:20286} Fully empty bladder: {Yes/No:304960894} Stream: {PT urination:27102} Urgency: {Yes/No:304960894} Frequency: *** Leakage: {PT leakage:27103} Pads: {Yes/No:304960894}  INTERCOURSE Pain with intercourse: {pain with intercourse PA:27099} Ability to have vaginal penetration:  {Yes/No:304960894} Climax: *** Marinoff Scale: ***/3  PREGNANCY Vaginal deliveries *** Tearing {Yes***/No:304960894} C-section deliveries *** Currently pregnant {Yes***/No:304960894}  PROLAPSE {PT prolapse:27101}    OBJECTIVE:   DIAGNOSTIC FINDINGS:  ***  PATIENT SURVEYS:  {rehab surveys:24030}  PFIQ-7 ***  COGNITION:  Overall cognitive status: {cognition:24006}     SENSATION:  Light touch: {intact/deficits:24005}  Proprioception: {intact/deficits:24005}  MUSCLE LENGTH: Hamstrings: Right *** deg; Left *** deg Thomas test: Right *** deg; Left ***  deg  LUMBAR SPECIAL TESTS:  {lumbar special test:25242}  FUNCTIONAL TESTS:  {Functional tests:24029}  GAIT: Distance walked: *** Assistive device utilized: {Assistive devices:23999} Level of assistance: {Levels of assistance:24026} Comments: ***               POSTURE: {posture:25561}   PELVIC ALIGNMENT:  LUMBARAROM/PROM  A/PROM A/PROM  eval  Flexion   Extension   Right lateral flexion   Left lateral flexion   Right rotation   Left rotation    (Blank rows = not tested)  LOWER EXTREMITY ROM:  {AROM/PROM:27142} ROM Right eval Left eval  Hip flexion    Hip extension    Hip abduction    Hip adduction    Hip internal rotation    Hip external rotation    Knee flexion    Knee extension    Ankle dorsiflexion    Ankle plantarflexion     Ankle inversion    Ankle eversion     (Blank rows = not tested)  LOWER EXTREMITY MMT:  MMT Right eval Left eval  Hip flexion    Hip extension    Hip abduction    Hip adduction    Hip internal rotation    Hip external rotation    Knee flexion    Knee extension    Ankle dorsiflexion    Ankle plantarflexion    Ankle inversion    Ankle eversion      PALPATION:   General  ***                External Perineal Exam ***                             Internal Pelvic Floor ***  Patient confirms identification and approves PT to assess internal pelvic floor and treatment {yes/no:20286}  PELVIC MMT:   MMT eval  Vaginal   Internal Anal Sphincter   External Anal Sphincter   Puborectalis   Diastasis Recti   (Blank rows = not tested)        TONE: ***  PROLAPSE: ***  TODAY'S TREATMENT  EVAL ***   PATIENT EDUCATION:  Education details: *** Person educated: {Person educated:25204} Education method: {Education Method:25205} Education comprehension: {Education Comprehension:25206}   HOME EXERCISE PROGRAM: ***  ASSESSMENT:  CLINICAL IMPRESSION: Patient is a *** y.o. *** who was seen today for physical therapy evaluation and treatment for ***.    OBJECTIVE IMPAIRMENTS {opptimpairments:25111}.   ACTIVITY LIMITATIONS {activitylimitations:27494}  PARTICIPATION LIMITATIONS: {participationrestrictions:25113}  PERSONAL FACTORS {Personal factors:25162} are also affecting patient's functional outcome.   REHAB POTENTIAL: {rehabpotential:25112}  CLINICAL DECISION MAKING: {clinical decision making:25114}  EVALUATION COMPLEXITY: {Evaluation complexity:25115}   GOALS: Goals reviewed with patient? {yes/no:20286}  SHORT TERM GOALS: Target date: {follow up:25551}  *** Baseline: Goal status: {GOALSTATUS:25110}  2.  *** Baseline:  Goal status: {GOALSTATUS:25110}  3.  *** Baseline:  Goal status: {GOALSTATUS:25110}  4.  *** Baseline:  Goal status:  {GOALSTATUS:25110}  5.  *** Baseline:  Goal status: {GOALSTATUS:25110}  6.  *** Baseline:  Goal status: {GOALSTATUS:25110}  LONG TERM GOALS: Target date: {follow up:25551}   *** Baseline:  Goal status: {GOALSTATUS:25110}  2.  *** Baseline:  Goal status: {GOALSTATUS:25110}  3.  *** Baseline:  Goal status: {GOALSTATUS:25110}  4.  *** Baseline:  Goal status: {GOALSTATUS:25110}  5.  *** Baseline:  Goal status: {GOALSTATUS:25110}  6.  *** Baseline:  Goal status: {GOALSTATUS:25110}  PLAN: PT FREQUENCY: {rehab frequency:25116}  PT DURATION: {  rehab duration:25117}  PLANNED INTERVENTIONS: {rehab planned interventions:25118::"Therapeutic exercises","Therapeutic activity","Neuromuscular re-education","Balance training","Gait training","Patient/Family education","Self Care","Joint mobilization"}  PLAN FOR NEXT SESSION: ***   Brayton Caves Jorja Empie, PT 07/27/2022, 7:53 AM

## 2022-08-09 NOTE — Therapy (Incomplete)
OUTPATIENT PHYSICAL THERAPY FEMALE PELVIC EVALUATION   Patient Name: Gina Baldwin MRN: [id number] DOB:05-Jul-1976, 46 y.o., female Today's Date: 08/09/2022    Past Medical History:  Diagnosis Date   ADHD    ADHD    Bipolar disorder ([facility])    Complication of anesthesia    slow to wake up   Past Surgical History:  Procedure Laterality Date   ORIF CLAVICULAR FRACTURE Right 10/11/2017   Procedure: PEN REDUCTION INTERNAL FIXATION OF CLAVICLE FRACTURE WITH ACROMIOCLAVICULAR RECONSTRUCTION;  Surgeon: Jones Broom, MD;  Location: [facility];  Service: Orthopedics;  Laterality: Right;   tubaligation     WISDOM TOOTH EXTRACTION     There are no problems to display for this patient.   PCP: Marisue Brooklyn  REFERRING PROVIDER: Nigel Bridgeman, CNM  REFERRING DIAG: M62.9 (ICD-10-CM) - Disorder of muscle, unspecified  THERAPY DIAG:  No diagnosis found.  Rationale for Evaluation and Treatment: Rehabilitation  ONSET DATE: ***  SUBJECTIVE:                                                                                                                                                                                           08/10/22 SUBJECTIVE STATEMENT: *** Fluid intake: {Yes/No:304960894}   PAIN:  Are you having pain? {yes/no:20286} NPRS scale: ***/10 Pain location: {pelvic pain location:27098}  Pain type: {type:313116} Pain description: {PAIN DESCRIPTION:21022940}   Aggravating factors: *** Relieving factors: ***  PRECAUTIONS: None  WEIGHT BEARING RESTRICTIONS: No  FALLS:  Has patient fallen in last 6 months? No  LIVING ENVIRONMENT: Lives with: {[facility] lives with:25569::"lives with their family"} Lives in: {Lives in:[address]70}   OCCUPATION: ***  PLOF: Independent  PATIENT GOALS: ***  PERTINENT HISTORY:  Ovarian cysts, perimenopausal, bipolar disorder, ADHD Sexual abuse: {Yes/No:304960894}  BOWEL MOVEMENT: Pain with bowel movement: {yes/no:20286} Type of  bowel movement:{PT BM type:27100} Fully empty rectum: {Yes/No:304960894} Leakage: {Yes/No:304960894} Pads: {Yes/No:304960894} Fiber supplement: {Yes/No:304960894}  URINATION: Pain with urination: {yes/no:20286} Fully empty bladder: {Yes/No:304960894} Stream: {PT urination:27102} Urgency: {Yes/No:304960894} Frequency: *** Leakage: {PT leakage:27103} Pads: {Yes/No:304960894}  INTERCOURSE: Pain with intercourse: {pain with intercourse PA:27099} Ability to have vaginal penetration:  {Yes/No:304960894} Climax: *** Marinoff Scale: ***/3  PREGNANCY: Vaginal deliveries *** Tearing {Yes***/No:304960894} C-section deliveries *** Currently pregnant {Yes***/No:304960894}  PROLAPSE: {PT prolapse:27101}   OBJECTIVE:  08/10/22:  PATIENT SURVEYS:   PFIQ-7 ***  COGNITION: Overall cognitive status: Within functional limits for tasks assessed     SENSATION: Light touch: Appears intact Proprioception: Appears intact  MUSCLE LENGTH:   FUNCTIONAL TESTS:   GAIT: Comments: ***  POSTURE: {posture:25561}  PELVIC ALIGNMENT:  LUMBARAROM/PROM:  A/PROM A/PROM  eval  Flexion  Extension   Right lateral flexion   Left lateral flexion   Right rotation   Left rotation    (Blank rows = not tested)  LOWER EXTREMITY MMT:  MMT Right eval Left eval  Hip flexion    Hip extension    Hip abduction    Hip adduction    Hip internal rotation    Hip external rotation    Knee flexion    Knee extension    Ankle dorsiflexion    Ankle plantarflexion    Ankle inversion    Ankle eversion     PALPATION:   General  ***                External Perineal Exam ***                             Internal Pelvic Floor ***  Patient confirms identification and approves PT to assess internal pelvic floor and treatment {yes/no:20286}  PELVIC MMT:   MMT eval  Vaginal   Internal Anal Sphincter   External Anal Sphincter   Puborectalis   Diastasis Recti   (Blank rows = not tested)         TONE: ***  PROLAPSE: ***  TODAY'S TREATMENT:                                                                                                                              DATE: 08/10/22  EVAL  Manual: Soft tissue mobilization: Scar tissue mobilization: Myofascial release: Spinal mobilization: Internal pelvic floor techniques: Dry needling: Neuromuscular re-education: Core retraining:  Core facilitation: Form correction: Pelvic floor contraction training: Down training: Exercises: Stretches/mobility: Strengthening: Therapeutic activities: Functional strengthening activities: Self-care:     PATIENT EDUCATION:  Education details: *** Person educated: Patient Education method: Consulting civil engineer, Media planner, Corporate treasurer cues, Verbal cues, and Handouts Education comprehension: verbalized understanding  HOME EXERCISE PROGRAM: ***  ASSESSMENT:  CLINICAL IMPRESSION: Patient is a *** y.o. *** who was seen today for physical therapy evaluation and treatment for ***.   OBJECTIVE IMPAIRMENTS: {opptimpairments:25111}.   ACTIVITY LIMITATIONS: {activitylimitations:27494}  PARTICIPATION LIMITATIONS: {participationrestrictions:25113}  PERSONAL FACTORS: {Personal factors:25162} are also affecting patient's functional outcome.   REHAB POTENTIAL: Good  CLINICAL DECISION MAKING: Stable/uncomplicated  EVALUATION COMPLEXITY: Low   GOALS: Goals reviewed with patient? Yes  SHORT TERM GOALS: Target date: {follow up:25551}  Pt will be independent with HEP.   Baseline: Goal status: INITIAL  2.  *** Baseline:  Goal status: {GOALSTATUS:25110}  3.  *** Baseline:  Goal status: {GOALSTATUS:25110}  4.  *** Baseline:  Goal status: {GOALSTATUS:25110}  5.  *** Baseline:  Goal status: {GOALSTATUS:25110}  6.  *** Baseline:  Goal status: {GOALSTATUS:25110}  LONG TERM GOALS: Target date: {follow up:25551}   Pt will be independent with advanced HEP.   Baseline:  Goal  status: INITIAL  2.  *** Baseline:  Goal status: {GOALSTATUS:25110}  3.  *** Baseline:  Goal status: {GOALSTATUS:25110}  4.  *** Baseline:  Goal status: {GOALSTATUS:25110}  5.  *** Baseline:  Goal status: {GOALSTATUS:25110}  6.  *** Baseline:  Goal status: {GOALSTATUS:25110}  PLAN:  PT FREQUENCY: {rehab frequency:25116}  PT DURATION: {rehab duration:25117}  PLANNED INTERVENTIONS: Therapeutic exercises, Therapeutic activity, Neuromuscular re-education, Balance training, Gait training, Patient/Family education, Self Care, Joint mobilization, Dry Needling, Biofeedback, and Manual therapy  PLAN FOR NEXT SESSION: ***   Heather Roberts, PT, DPT11/01/238:35 AM

## 2022-08-10 ENCOUNTER — Ambulatory Visit: Payer: Commercial Managed Care - HMO | Attending: Obstetrics and Gynecology

## 2023-08-17 ENCOUNTER — Other Ambulatory Visit: Payer: Self-pay | Admitting: Medical

## 2023-08-17 DIAGNOSIS — Z1231 Encounter for screening mammogram for malignant neoplasm of breast: Secondary | ICD-10-CM
